# Patient Record
Sex: Male | Born: 1970 | Race: White | Hispanic: No | Smoking: Current every day smoker
Health system: Southern US, Community
[De-identification: ages and names within clinical notes are randomized; demographics above are authoritative.]

## PROBLEM LIST (undated history)

## (undated) DIAGNOSIS — I10 Essential (primary) hypertension: Secondary | ICD-10-CM

## (undated) DIAGNOSIS — I509 Heart failure, unspecified: Secondary | ICD-10-CM

## (undated) DIAGNOSIS — K047 Periapical abscess without sinus: Secondary | ICD-10-CM

## (undated) DIAGNOSIS — K4090 Unilateral inguinal hernia, without obstruction or gangrene, not specified as recurrent: Secondary | ICD-10-CM

## (undated) DIAGNOSIS — I517 Cardiomegaly: Secondary | ICD-10-CM

---

## 2011-12-31 ENCOUNTER — Emergency Department: Payer: Self-pay | Admitting: Emergency Medicine

## 2015-05-28 ENCOUNTER — Other Ambulatory Visit: Payer: Self-pay

## 2015-05-28 ENCOUNTER — Emergency Department
Admission: EM | Admit: 2015-05-28 | Discharge: 2015-05-28 | Disposition: A | Discharge status: Home / Self Care | Payer: Self-pay | Attending: Emergency Medicine | Admitting: Emergency Medicine

## 2015-05-28 ENCOUNTER — Encounter: Payer: Self-pay | Admitting: *Deleted

## 2015-05-28 DIAGNOSIS — A084 Viral intestinal infection, unspecified: Secondary | ICD-10-CM | POA: Insufficient documentation

## 2015-05-28 DIAGNOSIS — N289 Disorder of kidney and ureter, unspecified: Secondary | ICD-10-CM | POA: Insufficient documentation

## 2015-05-28 DIAGNOSIS — E860 Dehydration: Secondary | ICD-10-CM | POA: Insufficient documentation

## 2015-05-28 DIAGNOSIS — F17200 Nicotine dependence, unspecified, uncomplicated: Secondary | ICD-10-CM | POA: Insufficient documentation

## 2015-05-28 HISTORY — DX: Periapical abscess without sinus: K04.7

## 2015-05-28 HISTORY — DX: Cardiomegaly: I51.7

## 2015-05-28 HISTORY — DX: Unilateral inguinal hernia, without obstruction or gangrene, not specified as recurrent: K40.90

## 2015-05-28 LAB — CBC WITH DIFFERENTIAL/PLATELET
BASOS ABS: 0.1 10*3/uL (ref 0–0.1)
BASOS PCT: 0 %
EOS ABS: 0.1 10*3/uL (ref 0–0.7)
EOS PCT: 0 %
HCT: 55 % — ABNORMAL HIGH (ref 40.0–52.0)
Hemoglobin: 18.1 g/dL — ABNORMAL HIGH (ref 13.0–18.0)
Lymphocytes Relative: 4 %
Lymphs Abs: 1.1 10*3/uL (ref 1.0–3.6)
MCH: 29 pg (ref 26.0–34.0)
MCHC: 32.9 g/dL (ref 32.0–36.0)
MCV: 88.1 fL (ref 80.0–100.0)
MONO ABS: 1.5 10*3/uL — AB (ref 0.2–1.0)
MONOS PCT: 6 %
Neutro Abs: 22.8 10*3/uL — ABNORMAL HIGH (ref 1.4–6.5)
Neutrophils Relative %: 90 %
PLATELETS: 349 10*3/uL (ref 150–440)
RBC: 6.24 MIL/uL — ABNORMAL HIGH (ref 4.40–5.90)
RDW: 13.7 % (ref 11.5–14.5)
WBC: 25.6 10*3/uL — ABNORMAL HIGH (ref 3.8–10.6)

## 2015-05-28 LAB — COMPREHENSIVE METABOLIC PANEL
ALBUMIN: 4.2 g/dL (ref 3.5–5.0)
ALK PHOS: 102 U/L (ref 38–126)
ALT: 28 U/L (ref 17–63)
ANION GAP: 16 — AB (ref 5–15)
AST: 23 U/L (ref 15–41)
BILIRUBIN TOTAL: 1.5 mg/dL — AB (ref 0.3–1.2)
BUN: 22 mg/dL — AB (ref 6–20)
CALCIUM: 9.3 mg/dL (ref 8.9–10.3)
CO2: 23 mmol/L (ref 22–32)
CREATININE: 2.22 mg/dL — AB (ref 0.61–1.24)
Chloride: 96 mmol/L — ABNORMAL LOW (ref 101–111)
GFR calc Af Amer: 40 mL/min — ABNORMAL LOW (ref >60)
GFR calc non Af Amer: 34 mL/min — ABNORMAL LOW (ref >60)
GLUCOSE: 119 mg/dL — AB (ref 65–99)
Potassium: 3.4 mmol/L — ABNORMAL LOW (ref 3.5–5.1)
Sodium: 135 mmol/L (ref 135–145)
TOTAL PROTEIN: 6.9 g/dL (ref 6.5–8.1)

## 2015-05-28 LAB — CREATININE, SERUM
CREATININE: 1.85 mg/dL — AB (ref 0.61–1.24)
GFR calc non Af Amer: 43 mL/min — ABNORMAL LOW (ref >60)
GFR, EST AFRICAN AMERICAN: 49 mL/min — AB (ref >60)

## 2015-05-28 LAB — TROPONIN I

## 2015-05-28 LAB — MAGNESIUM: Magnesium: 1.5 mg/dL — ABNORMAL LOW (ref 1.7–2.4)

## 2015-05-28 MED ORDER — SODIUM CHLORIDE 0.9 % IV BOLUS (SEPSIS)
1000.0000 mL | Freq: Once | INTRAVENOUS | Status: AC
Start: 2015-05-28 — End: 2015-05-28
  Administered 2015-05-28: 1000 mL via INTRAVENOUS

## 2015-05-28 MED ORDER — ONDANSETRON HCL 4 MG/2ML IJ SOLN
4.0000 mg | Freq: Once | INTRAMUSCULAR | Status: AC
  Administered 2015-05-28: 4 mg via INTRAVENOUS
  Filled 2015-05-28: qty 2

## 2015-05-28 MED ORDER — SODIUM CHLORIDE 0.9 % IV BOLUS (SEPSIS)
1000.0000 mL | Freq: Once | INTRAVENOUS | Status: AC
  Administered 2015-05-28: 1000 mL via INTRAVENOUS

## 2015-05-28 MED ORDER — ONDANSETRON HCL 4 MG PO TABS: 4.0000 mg | ORAL_TABLET | Freq: Three times a day (TID) | ORAL | Status: DC | PRN

## 2015-05-28 NOTE — ED Notes (Addendum)
Per patient's report, he went to give plasma today and felt hot and weak and dizzy and had blurry vision. Patient was brought here by EMS from homeless [facility] after having vomiting and diarrhea since returning from giving plasma. Patient states he had chest pain last night at work. Patient also reports having infection in 3 teeth. Patient states he was discharged from [facility] last week for bipolar episode.

## 2015-05-28 NOTE — Discharge Instructions (Signed)
Dehydration, Adult °Dehydration is a condition in which you do not have enough fluid or water in your body. It happens when you take in less fluid than you lose. Vital organs such as the kidneys, brain, and heart cannot function without a proper amount of fluids. Any loss of fluids from the body can cause dehydration.  °Dehydration can range from mild to severe. This condition should be treated right away to help prevent it from becoming severe. °CAUSES  °This condition may be caused by: °· Vomiting. °· Diarrhea. °· Excessive sweating, such as when exercising in hot or humid weather. °· Not drinking enough fluid during strenuous exercise or during an illness. °· Excessive urine output. °· Fever. °· Certain medicines. °RISK FACTORS °This condition is more likely to develop in: °· People who are taking certain medicines that cause the body to lose excess fluid (diuretics).   °· People who have a chronic illness, such as diabetes, that may increase urination. °· Older adults.   °· People who live at high altitudes.   °· People who participate in endurance sports.   °SYMPTOMS  °Mild Dehydration °· Thirst. °· Dry lips. °· Slightly dry mouth. °· Dry, warm skin. °Moderate Dehydration °· Very dry mouth.   °· Muscle cramps.   °· Dark urine and decreased urine production.   °· Decreased tear production.   °· Headache.   °· Light-headedness, especially when you stand up from a sitting position.   °Severe Dehydration °· Changes in skin.   °¨ Cold and clammy skin.   °¨ Skin does not spring back quickly when lightly pinched and released.   °· Changes in body fluids.   °¨ Extreme thirst.   °¨ No tears.   °¨ Not able to sweat when body temperature is high, such as in hot weather.   °¨ Minimal urine production.   °· Changes in vital signs.   °¨ Rapid, weak pulse (more than 100 beats per minute when you are sitting still).   °¨ Rapid breathing.   °¨ Low blood pressure.   °· Other changes.   °¨ Sunken eyes.   °¨ Cold hands and feet.    °¨ Confusion. °¨ Lethargy and difficulty being awakened. °¨ Fainting (syncope).   °¨ Short-term weight loss.   °¨ Unconsciousness. °DIAGNOSIS  °This condition may be diagnosed based on your symptoms. You may also have tests to determine how severe your dehydration is. These tests may include:  °· Urine tests.   °· Blood tests.   °TREATMENT  °Treatment for this condition depends on the severity. Mild or moderate dehydration can often be treated at home. Treatment should be started right away. Do not wait until dehydration becomes severe. Severe dehydration needs to be treated at the hospital. °Treatment for Mild Dehydration °· Drinking plenty of water to replace the fluid you have lost.   °· Replacing minerals in your blood (electrolytes) that you may have lost.   °Treatment for Moderate Dehydration  °· Consuming oral rehydration solution (ORS). °Treatment for Severe Dehydration °· Receiving fluid through an IV tube.   °· Receiving electrolyte solution through a feeding tube that is passed through your nose and into your stomach (nasogastric tube or NG tube). °· Correcting any abnormalities in electrolytes. °HOME CARE INSTRUCTIONS  °· Drink enough fluid to keep your urine clear or pale yellow.   °· Drink water or fluid slowly by taking small sips. You can also try sucking on ice cubes.  °· Have food or beverages that contain electrolytes. Examples include bananas and sports drinks. °· Take over-the-counter and prescription medicines only as told by your health care provider.   °· Prepare ORS according to the manufacturer's instructions. Take sips   of ORS every 5 minutes until your urine returns to normal.  If you have vomiting or diarrhea, continue to try to drink water, ORS, or both.   If you have diarrhea, avoid:   Beverages that contain caffeine.   Fruit juice.   Milk.   Carbonated soft drinks.  Do not take salt tablets. This can lead to the condition of having too much sodium in your body  (hypernatremia).  SEEK MEDICAL CARE IF:  You cannot eat or drink without vomiting.  You have had moderate diarrhea during a period of more than 24 hours.  You have a fever. SEEK IMMEDIATE MEDICAL CARE IF:   You have extreme thirst.  You have severe diarrhea.  You have not urinated in 6-8 hours, or you have urinated only a small amount of very dark urine.  You have shriveled skin.  You are dizzy, confused, or both.   This information is not intended to replace advice given to you by your health care provider. Make sure you discuss any questions you have with your health care provider.   Document Released: 05/25/2005 Document Revised: 02/13/2015 Document Reviewed: 10/10/2014 Elsevier Interactive Patient Education [employer]  Please return immediately if condition worsens. Please contact her primary physician or the physician you were given for referral. If you have any specialist physicians involved in her treatment and plan please also contact them. Thank you for using [place] [facility]. Please drink plenty of fluids and advance her diet as tolerated. If he develops a fever, bloody diarrhea, or any other new concerns and all means return to emergency department for further evaluation

## 2015-05-28 NOTE — ED Provider Notes (Signed)
Time Seen: Approximately *1620 I have reviewed the triage notes  Chief Complaint: Emesis and Diarrhea   History of Present Illness: Paul Mccall is a 44 y.o. male *who presents with acute onset of nausea vomiting and diarrhea. Patient states the symptoms started approximately at noon. [facility]. He states earlier today that he was giving plasma and states that he had to stop because he was feeling hot, weak and dizzy. He denies any syncopal episode. He denies any chest pain at this time but states yesterday he had some chest discomfort while he was at work. It sounds as though it was very brief in nature without any radiation to the arm job back or flank area. He denies any shortness of breath. He states he was recently discharged from [facility] where he was under treatment for a bipolar episode. He states he was prescribed multiple medications such as Seroquel, etc. has not had any of his medication filled at this time. He denies any suicidal thoughts, homicidal thoughts, hallucinations. He's had recent dental extraction but states he was not placed on antibiotics and has had no recent travel Past Medical History  Diagnosis Date  . Enlarged heart   . Hernia, inguinal, right   . Dental infection     There are no active problems to display for this patient.   No past surgical history on file.  No past surgical history on file.  No current outpatient prescriptions on file.  Allergies:  Review of patient's allergies indicates no known allergies.  Family History: No family history on file.  Social History: Social History  Substance Use Topics  . Smoking status: Current Every Day Smoker  . Smokeless tobacco: Not on file  . Alcohol Use: Yes     Comment: occasionally   No illicit drug usage  Review of Systems:   10 point review of systems was performed and was otherwise negative:  Constitutional: No fever Eyes: No visual  disturbances ENT: No sore throat, ear pain Cardiac: No current chest pain Respiratory: No shortness of breath, wheezing, or stridor Abdomen: Mild diffuse abdominal cramping without melena or hematochezia. He denies any hematemesis or biliary emesis. He states mostly loose watery stool. Endocrine: No weight loss, No night sweats Extremities: No peripheral edema, cyanosis Skin: No rashes, easy bruising Neurologic: No focal weakness, trouble with speech or swollowing Urologic: No dysuria, Hematuria, or urinary frequency   Physical Exam:  ED Triage Vitals  Enc Vitals Group     BP 05/28/15 1549 71/47 mmHg     Pulse Rate 05/28/15 1549 113     Resp 05/28/15 1549 22     Temp 05/28/15 1549 97.5 F (36.4 C)     Temp Source 05/28/15 1549 Oral     SpO2 05/28/15 1549 96 %     Weight 05/28/15 1549 180 lb (81.647 kg)     Height 05/28/15 1549  (1.702 m)     Head Cir --      Peak Flow --      Pain Score 05/28/15 1550 8     Pain Loc --      Pain Edu? --      Excl. in GC? --     General: Awake , Alert , and Oriented times 3; GCS 15 Head: Normal cephalic , atraumatic Eyes: Pupils equal , round, reactive to light Nose/Throat: No nasal drainage, patent upper airway without erythema or exudate.  Neck: Supple, Full range  of motion, No anterior adenopathy or palpable thyroid masses Lungs: Clear to ascultation without wheezes , rhonchi, or rales Heart: Regular rate, regular rhythm without murmurs , gallops , or rubs Abdomen: Soft, non tender without rebound, guarding , or rigidity; bowel sounds positive and symmetric in all 4 quadrants. No organomegaly .        Extremities: 2 plus symmetric pulses. No edema, clubbing or cyanosis Neurologic: normal ambulation, Motor symmetric without deficits, sensory intact Skin: warm, dry, no rashes   Labs:   All laboratory work was reviewed including any pertinent negatives or positives listed below:  Labs Reviewed  CBC WITH DIFFERENTIAL/PLATELET -  Abnormal; Notable for the following:    WBC 25.6 (*)    RBC 6.24 (*)    Hemoglobin 18.1 (*)    HCT 55.0 (*)    Neutro Abs 22.8 (*)    Monocytes Absolute 1.5 (*)    All other components within normal limits  C DIFFICILE QUICK SCREEN W PCR REFLEX  GASTROINTESTINAL PANEL BY PCR, STOOL (REPLACES STOOL CULTURE)  COMPREHENSIVE METABOLIC PANEL  MAGNESIUM  TROPONIN I   The patient was unable to supply [place] with a stool sample after several hours of observation here in emergency department. His initial creatinine level was 2.22 and after a liter of fluid repeat was down to 1.85. EKG: ED ECG REPORT I, Jennye Moccasin, the attending physician, personally viewed and interpreted this ECG.  Date: 05/28/2015 EKG Time: 1555 Rate: 103 Rhythm: normal sinus rhythm QRS Axis: normal Intervals: normal ST/T Wave abnormalities: normal Conduction Disutrbances: none Narrative Interpretation: unremarkable Left ventricular hypertrophy    ED Course:  Patient was given a total of 2 L of fluid and had stabilization of his heart rate along with his blood pressure. Patient denies any symptoms of nausea at this time after a single dose of IV Zofran. He is able to tolerate oral fluids and with the decrease in his creatinine and I gave him another additional liter of fluid. I felt he would continue to improve on an outpatient basis. Along with the giving of blood. His white count is likely elevated due to hemoconcentration given the height hemoglobin and hematocrit at this time. He has no abdominal pain or findings of a surgical issue.    Assessment:  Viral gastroenteritis Dehydration Mild renal insufficiency    Plan: * Outpatient management Patient was advised to return immediately if condition worsens. Patient was advised to follow up with their primary care physician or other specialized physicians involved in their outpatient care             Jennye Moccasin, MD 05/28/15 2124

## 2017-02-23 ENCOUNTER — Emergency Department: Payer: Self-pay

## 2017-02-23 ENCOUNTER — Emergency Department
Admission: EM | Admit: 2017-02-23 | Discharge: 2017-02-23 | Disposition: A | Discharge status: Home / Self Care | Payer: Self-pay | Attending: Emergency Medicine | Admitting: Emergency Medicine

## 2017-02-23 ENCOUNTER — Encounter: Payer: Self-pay | Admitting: *Deleted

## 2017-02-23 DIAGNOSIS — F17200 Nicotine dependence, unspecified, uncomplicated: Secondary | ICD-10-CM | POA: Insufficient documentation

## 2017-02-23 DIAGNOSIS — R1032 Left lower quadrant pain: Secondary | ICD-10-CM | POA: Insufficient documentation

## 2017-02-23 DIAGNOSIS — R109 Unspecified abdominal pain: Secondary | ICD-10-CM

## 2017-02-23 DIAGNOSIS — I861 Scrotal varices: Secondary | ICD-10-CM | POA: Insufficient documentation

## 2017-02-23 HISTORY — DX: Heart failure, unspecified: I50.9

## 2017-02-23 HISTORY — DX: Essential (primary) hypertension: I10

## 2017-02-23 LAB — COMPREHENSIVE METABOLIC PANEL
ALBUMIN: 3.9 g/dL (ref 3.5–5.0)
ALK PHOS: 95 U/L (ref 38–126)
ALT: 17 U/L (ref 17–63)
AST: 17 U/L (ref 15–41)
Anion gap: 10 (ref 5–15)
BILIRUBIN TOTAL: 0.6 mg/dL (ref 0.3–1.2)
BUN: 10 mg/dL (ref 6–20)
CALCIUM: 9.1 mg/dL (ref 8.9–10.3)
CO2: 22 mmol/L (ref 22–32)
CREATININE: 0.91 mg/dL (ref 0.61–1.24)
Chloride: 107 mmol/L (ref 101–111)
GFR calc Af Amer: >60 mL/min (ref >60)
GFR calc non Af Amer: >60 mL/min (ref >60)
GLUCOSE: 86 mg/dL (ref 65–99)
Potassium: 3.7 mmol/L (ref 3.5–5.1)
Sodium: 139 mmol/L (ref 135–145)
TOTAL PROTEIN: 7.1 g/dL (ref 6.5–8.1)

## 2017-02-23 LAB — CBC
HEMATOCRIT: 45.4 % (ref 40.0–52.0)
HEMOGLOBIN: 15.7 g/dL (ref 13.0–18.0)
MCH: 29.6 pg (ref 26.0–34.0)
MCHC: 34.6 g/dL (ref 32.0–36.0)
MCV: 85.7 fL (ref 80.0–100.0)
Platelets: 276 10*3/uL (ref 150–440)
RBC: 5.3 MIL/uL (ref 4.40–5.90)
RDW: 13.6 % (ref 11.5–14.5)
WBC: 9.6 10*3/uL (ref 3.8–10.6)

## 2017-02-23 LAB — URINALYSIS, COMPLETE (UACMP) WITH MICROSCOPIC
BACTERIA UA: NONE SEEN
Bilirubin Urine: NEGATIVE
Glucose, UA: NEGATIVE mg/dL
KETONES UR: NEGATIVE mg/dL
Leukocytes, UA: NEGATIVE
NITRITE: NEGATIVE
PROTEIN: NEGATIVE mg/dL
Specific Gravity, Urine: 1.006 (ref 1.005–1.030)
Squamous Epithelial / LPF: NONE SEEN
pH: 6 (ref 5.0–8.0)

## 2017-02-23 LAB — LIPASE, BLOOD: Lipase: 19 U/L (ref 11–51)

## 2017-02-23 MED ORDER — IOPAMIDOL (ISOVUE-300) INJECTION 61%
30.0000 mL | Freq: Once | INTRAVENOUS | Status: AC
  Administered 2017-02-23: 30 mL via ORAL

## 2017-02-23 MED ORDER — DOXYCYCLINE HYCLATE 100 MG PO TABS: 100.0000 mg | ORAL_TABLET | Freq: Two times a day (BID) | ORAL | Status: DC

## 2017-02-23 MED ORDER — IOPAMIDOL (ISOVUE-300) INJECTION 61%
100.0000 mL | Freq: Once | INTRAVENOUS | Status: AC | PRN
  Administered 2017-02-23: 100 mL via INTRAVENOUS

## 2017-02-23 MED ORDER — HYDROMORPHONE HCL 1 MG/ML IJ SOLN
1.0000 mg | Freq: Once | INTRAMUSCULAR | Status: AC
Start: 2017-02-23 — End: 2017-02-23
  Administered 2017-02-23: 1 mg via INTRAVENOUS
  Filled 2017-02-23: qty 1

## 2017-02-23 MED ORDER — OXYCODONE-ACETAMINOPHEN 5-325 MG PO TABS: 1.0000 | ORAL_TABLET | Freq: Four times a day (QID) | ORAL | 0 refills | Status: AC | PRN

## 2017-02-23 MED ORDER — SODIUM CHLORIDE 0.9 % IV BOLUS (SEPSIS)
500.0000 mL | Freq: Once | INTRAVENOUS | Status: AC
  Administered 2017-02-23: 500 mL via INTRAVENOUS

## 2017-02-23 MED ORDER — ONDANSETRON HCL 4 MG PO TABS: 4.0000 mg | ORAL_TABLET | Freq: Three times a day (TID) | ORAL | 0 refills | Status: DC | PRN

## 2017-02-23 MED ORDER — ONDANSETRON HCL 4 MG/2ML IJ SOLN
4.0000 mg | Freq: Once | INTRAMUSCULAR | Status: AC
  Administered 2017-02-23: 4 mg via INTRAVENOUS
  Filled 2017-02-23: qty 2

## 2017-02-23 NOTE — ED Provider Notes (Addendum)
[facility]  ____________________________________________   I have reviewed the triage vital signs and the nursing notes.   HISTORY  Chief Complaint Abdominal Pain    HPI Johnmichael Helman is a 46 y.o. male who states she had a hernia repair little over a month ago at [facility] and at that time he was told he had diverticulosis. He states he's been doing well since his surgery and then this morning at 4:00 he woke up with left lower quadrant abdominal pain. No vomiting no fever positive bowel movement last bowel movement this morning it was diarrhea nonradiating, sharp, nothing makes it better and nothing makes it worse. Feels like "a stitch in side".     Past Medical History:  Diagnosis Date  . Dental infection   . Enlarged heart   . Hernia, inguinal, right     There are no active problems to display for this patient.   History reviewed. No pertinent surgical history.  Prior to Admission medications   Medication Sig Start Date End Date Taking? Authorizing Provider  ondansetron (ZOFRAN) 4 MG tablet Take 1 tablet (4 mg total) by mouth every 8 (eight) hours as needed for nausea or vomiting. 05/28/15   Jennye Moccasin, MD    Allergies Patient has no known allergies.  History reviewed. No pertinent family history.  Social History Social History  Substance Use Topics  . Smoking status: Current Every Day Smoker  . Smokeless tobacco: Not on file  . Alcohol use Yes     Comment: occasionally    Review of Systems Constitutional: No fever/chills Eyes: No visual changes. ENT: No sore throat. No stiff neck no neck pain Cardiovascular: Denies chest pain. Respiratory: Denies shortness of breath. Gastrointestinal:   See history of present illness Genitourinary: Negative for dysuria. Musculoskeletal: Negative lower extremity swelling Skin: Negative for rash. Neurological: Negative for severe headaches, focal  weakness or numbness.   ____________________________________________   PHYSICAL EXAM:  VITAL SIGNS: ED Triage Vitals  Enc Vitals Group     BP 02/23/17 0948 (!) 151/112     Pulse Rate 02/23/17 0948 91     Resp 02/23/17 0948 16     Temp 02/23/17 0948 97.6 F (36.4 C)     Temp Source 02/23/17 0948 Oral     SpO2 02/23/17 0948 98 %     Weight 02/23/17 0948 175 lb (79.4 kg)     Height 02/23/17 0948 5\' 8"  (1.727 m)     Head Circumference --      Peak Flow --      Pain Score 02/23/17 0947 10     Pain Loc --      Pain Edu? --      Excl. in GC? --     Constitutional: Alert and oriented. Well appearing Ears uncomfortable but nontoxic Eyes: Conjunctivae are normal Head: Atraumatic HEENT: No congestion/rhinnorhea. Mucous membranes are moist.  Oropharynx non-erythematous Neck:   Nontender with no meningismus, no masses, no stridor Cardiovascular: Normal rate, regular rhythm. Grossly normal heart sounds.  Good peripheral circulation. Respiratory: Normal respiratory effort.  No retractions. Lungs CTAB. Abdominal: Soft and Tender to palpation in the left lower quadrant, voluntary guarding, no rebound. No distention. Back:  There is no focal tenderness or step off.  there is no midline tenderness there are no lesions noted. there is no CVA tenderness GU: There is a fullness noted to the left testicle region but no pain or erythema Musculoskeletal: No lower extremity  tenderness, no upper extremity tenderness. No joint effusions, no DVT signs strong distal pulses no edema Neurologic:  Normal speech and language. No gross focal neurologic deficits are appreciated.  Skin:  Skin is warm, dry and intact. No rash noted. Psychiatric: Mood and affect are normal. Speech and behavior are normal.  ____________________________________________   LABS (all labs ordered are listed, but only abnormal results are displayed)  Labs Reviewed  LIPASE, BLOOD  COMPREHENSIVE METABOLIC PANEL  CBC   URINALYSIS, COMPLETE (UACMP) WITH MICROSCOPIC   ____________________________________________  EKG  I personally interpreted any EKGs ordered by me or triage Size rhythm rate 92 bpm normal axis no acute ST elevation or depression, nonspecific ST changes, PVC noted ____________________________________________  RADIOLOGY  I reviewed any imaging ordered by me or triage that were performed during my shift and, if possible, patient and/or family made aware of any abnormal findings. ____________________________________________   PROCEDURES  Procedure(s) performed: None  Procedures  Critical Care performed: None  ____________________________________________   INITIAL IMPRESSION / ASSESSMENT AND PLAN / ED COURSE  Pertinent labs & imaging results that were available during my care of the patient were reviewed by me and considered in my medical decision making (see chart for details).  pt with abdominal pain, one month or so status post hernia surgery. We will obtain CT scan, he also has a history of diverticulosis, differential would include abscess from surgery which is somewhat late, internal obstruction although low suspicion of this, as well as diverticular pathology etc.  ----------------------------------------- 1:10 PM on 02/23/2017 -----------------------------------------  Patient pain is better controlled. This time, CT scan is quite reassuring. 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 Blood work is reassuring CT scan is reassuring vital signs are reassuring. Patient is doing better we'll try by mouth challenge. Given diarrhea which she initially did not report and inflammatory changes consistent with  infectious pathology in his abdomen, this is most likely a viral pathology no evidence of acute surgical pathology noted today. 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 Patient does have some discomfort, may require short course of pain medication at home. Return precautions and follow-up given and understood.  ----------------------------------------- 2:39 PM on 02/23/2017 -----------------------------------------  She is feeling much better abdomen remains benign, he is sleeping in the room, we will discharge him with close outpatient follow-up with urology. Incidentally noted variceal and with his home surgeon. In addition, return precautions here been given and understood She declines ultrasound or further imaging of his scrotum and I don't think is unreasonable as it is nontender and according to him chronic. Certainly to [place] from the obvious clear source of his pain which is his left side of his abdomen where his tenderness is and where also he has CT findings of inflammation without evidence of perforation or diverticular disease      ____________________________________________   FINAL CLINICAL IMPRESSION(S) / ED DIAGNOSES  Final diagnoses:  None      This chart was dictated using voice recognition software.  Despite best efforts to proofread,  errors can occur  which can change meaning.      Jeanmarie Plant, MD 02/23/17 1012    Jeanmarie Plant, MD 02/23/17 1312    Jeanmarie Plant, MD 02/23/17 1440    Jeanmarie Plant, MD 02/23/17 1447

## 2017-02-23 NOTE — ED Notes (Signed)
Patient transported to CT 

## 2017-02-23 NOTE — ED Notes (Signed)
Pt given meal tray per edp

## 2017-02-23 NOTE — ED Triage Notes (Signed)
Pt arrives with complaints of left sided abd pain that began this AM at 0400, states nausea, last BM this AM, states hernia surgery last month, states pain comes and goes like spasms

## 2017-02-23 NOTE — ED Notes (Signed)
Pt to eat prior to discharge

## 2017-02-23 NOTE — ED Notes (Signed)
Patient to Room 5.  [facility] RN aware of placement in room.

## 2018-07-11 ENCOUNTER — Inpatient Hospital Stay (HOSPITAL_COMMUNITY)
Admission: EM | Admit: 2018-07-11 | Discharge: 2018-07-19 | DRG: 291 | Disposition: A | Discharge status: Home / Self Care | Payer: Medicaid Other | Attending: Internal Medicine | Admitting: Internal Medicine

## 2018-07-11 ENCOUNTER — Other Ambulatory Visit: Payer: Self-pay

## 2018-07-11 ENCOUNTER — Encounter (HOSPITAL_COMMUNITY): Payer: Self-pay | Admitting: *Deleted

## 2018-07-11 ENCOUNTER — Emergency Department (HOSPITAL_COMMUNITY): Payer: Medicaid Other

## 2018-07-11 ENCOUNTER — Inpatient Hospital Stay (HOSPITAL_COMMUNITY): Payer: Medicaid Other

## 2018-07-11 DIAGNOSIS — I5082 Biventricular heart failure: Secondary | ICD-10-CM | POA: Diagnosis present

## 2018-07-11 DIAGNOSIS — F419 Anxiety disorder, unspecified: Secondary | ICD-10-CM | POA: Diagnosis present

## 2018-07-11 DIAGNOSIS — I130 Hypertensive heart and chronic kidney disease with heart failure and stage 1 through stage 4 chronic kidney disease, or unspecified chronic kidney disease: Secondary | ICD-10-CM | POA: Diagnosis present

## 2018-07-11 DIAGNOSIS — I240 Acute coronary thrombosis not resulting in myocardial infarction: Secondary | ICD-10-CM | POA: Diagnosis present

## 2018-07-11 DIAGNOSIS — I509 Heart failure, unspecified: Secondary | ICD-10-CM

## 2018-07-11 DIAGNOSIS — F17210 Nicotine dependence, cigarettes, uncomplicated: Secondary | ICD-10-CM | POA: Diagnosis present

## 2018-07-11 DIAGNOSIS — Z79899 Other long term (current) drug therapy: Secondary | ICD-10-CM | POA: Diagnosis not present

## 2018-07-11 DIAGNOSIS — F319 Bipolar disorder, unspecified: Secondary | ICD-10-CM | POA: Diagnosis present

## 2018-07-11 DIAGNOSIS — Z9114 Patient's other noncompliance with medication regimen: Secondary | ICD-10-CM | POA: Diagnosis not present

## 2018-07-11 DIAGNOSIS — E876 Hypokalemia: Secondary | ICD-10-CM | POA: Diagnosis present

## 2018-07-11 DIAGNOSIS — I5023 Acute on chronic systolic (congestive) heart failure: Secondary | ICD-10-CM | POA: Diagnosis present

## 2018-07-11 DIAGNOSIS — R079 Chest pain, unspecified: Secondary | ICD-10-CM | POA: Diagnosis present

## 2018-07-11 DIAGNOSIS — E871 Hypo-osmolality and hyponatremia: Secondary | ICD-10-CM | POA: Diagnosis present

## 2018-07-11 DIAGNOSIS — I472 Ventricular tachycardia: Secondary | ICD-10-CM | POA: Diagnosis present

## 2018-07-11 DIAGNOSIS — Z9119 Patient's noncompliance with other medical treatment and regimen: Secondary | ICD-10-CM | POA: Diagnosis not present

## 2018-07-11 DIAGNOSIS — I447 Left bundle-branch block, unspecified: Secondary | ICD-10-CM | POA: Diagnosis present

## 2018-07-11 DIAGNOSIS — I080 Rheumatic disorders of both mitral and aortic valves: Secondary | ICD-10-CM | POA: Diagnosis present

## 2018-07-11 DIAGNOSIS — F1410 Cocaine abuse, uncomplicated: Secondary | ICD-10-CM | POA: Diagnosis present

## 2018-07-11 DIAGNOSIS — N182 Chronic kidney disease, stage 2 (mild): Secondary | ICD-10-CM | POA: Diagnosis present

## 2018-07-11 DIAGNOSIS — K761 Chronic passive congestion of liver: Secondary | ICD-10-CM | POA: Diagnosis present

## 2018-07-11 DIAGNOSIS — R1010 Upper abdominal pain, unspecified: Secondary | ICD-10-CM

## 2018-07-11 DIAGNOSIS — I420 Dilated cardiomyopathy: Secondary | ICD-10-CM | POA: Diagnosis present

## 2018-07-11 DIAGNOSIS — N179 Acute kidney failure, unspecified: Secondary | ICD-10-CM | POA: Diagnosis present

## 2018-07-11 LAB — I-STAT TROPONIN, ED: Troponin i, poc: 0.03 ng/mL (ref 0.00–0.08)

## 2018-07-11 LAB — CBC
HCT: 44.3 % (ref 39.0–52.0)
Hemoglobin: 13.8 g/dL (ref 13.0–17.0)
MCH: 27.7 pg (ref 26.0–34.0)
MCHC: 31.2 g/dL (ref 30.0–36.0)
MCV: 89 fL (ref 80.0–100.0)
Platelets: 327 10*3/uL (ref 150–400)
RBC: 4.98 MIL/uL (ref 4.22–5.81)
RDW: 14 % (ref 11.5–15.5)
WBC: 12.9 10*3/uL — ABNORMAL HIGH (ref 4.0–10.5)
nRBC: 0 % (ref 0.0–0.2)

## 2018-07-11 LAB — HEPATIC FUNCTION PANEL
ALT: 63 U/L — ABNORMAL HIGH (ref 0–44)
AST: 52 U/L — ABNORMAL HIGH (ref 15–41)
Albumin: 3.2 g/dL — ABNORMAL LOW (ref 3.5–5.0)
Alkaline Phosphatase: 107 U/L (ref 38–126)
Bilirubin, Direct: 0.2 mg/dL (ref 0.0–0.2)
Indirect Bilirubin: 0.4 mg/dL (ref 0.3–0.9)
TOTAL PROTEIN: 6.8 g/dL (ref 6.5–8.1)
Total Bilirubin: 0.6 mg/dL (ref 0.3–1.2)

## 2018-07-11 LAB — BASIC METABOLIC PANEL
Anion gap: 14 (ref 5–15)
BUN: 15 mg/dL (ref 6–20)
CHLORIDE: 103 mmol/L (ref 98–111)
CO2: 23 mmol/L (ref 22–32)
Calcium: 9 mg/dL (ref 8.9–10.3)
Creatinine, Ser: 1.28 mg/dL — ABNORMAL HIGH (ref 0.61–1.24)
GFR calc Af Amer: >60 mL/min (ref >60)
GFR calc non Af Amer: >60 mL/min (ref >60)
Glucose, Bld: 147 mg/dL — ABNORMAL HIGH (ref 70–99)
Potassium: 3.1 mmol/L — ABNORMAL LOW (ref 3.5–5.1)
Sodium: 140 mmol/L (ref 135–145)

## 2018-07-11 LAB — BRAIN NATRIURETIC PEPTIDE: B NATRIURETIC PEPTIDE 5: 1099.3 pg/mL — AB (ref 0.0–100.0)

## 2018-07-11 LAB — LIPASE, BLOOD: Lipase: 32 U/L (ref 11–51)

## 2018-07-11 MED ORDER — POTASSIUM CHLORIDE CRYS ER 20 MEQ PO TBCR
20.0000 meq | EXTENDED_RELEASE_TABLET | Freq: Two times a day (BID) | ORAL | Status: DC
  Administered 2018-07-11: 20 meq via ORAL
  Filled 2018-07-11: qty 1

## 2018-07-11 MED ORDER — ONDANSETRON HCL 4 MG PO TABS: 4.0000 mg | ORAL_TABLET | Freq: Four times a day (QID) | ORAL | Status: DC | PRN

## 2018-07-11 MED ORDER — ENOXAPARIN SODIUM 40 MG/0.4ML ~~LOC~~ SOLN
40.0000 mg | SUBCUTANEOUS | Status: DC
  Administered 2018-07-11 – 2018-07-12 (×2): 40 mg via SUBCUTANEOUS
  Filled 2018-07-11 (×2): qty 0.4

## 2018-07-11 MED ORDER — SODIUM CHLORIDE 0.9% FLUSH: 3.0000 mL | Freq: Once | INTRAVENOUS | Status: DC

## 2018-07-11 MED ORDER — FUROSEMIDE 10 MG/ML IJ SOLN
80.0000 mg | Freq: Once | INTRAMUSCULAR | Status: AC
  Administered 2018-07-11: 80 mg via INTRAVENOUS
  Filled 2018-07-11: qty 8

## 2018-07-11 MED ORDER — POTASSIUM CHLORIDE 10 MEQ/100ML IV SOLN
10.0000 meq | INTRAVENOUS | Status: AC
  Administered 2018-07-11 – 2018-07-12 (×2): 10 meq via INTRAVENOUS
  Filled 2018-07-11 (×2): qty 100

## 2018-07-11 MED ORDER — ALBUTEROL SULFATE (2.5 MG/3ML) 0.083% IN NEBU: 2.5000 mg | INHALATION_SOLUTION | RESPIRATORY_TRACT | Status: DC | PRN

## 2018-07-11 MED ORDER — ONDANSETRON HCL 4 MG/2ML IJ SOLN: 4.0000 mg | Freq: Four times a day (QID) | INTRAMUSCULAR | Status: DC | PRN

## 2018-07-11 MED ORDER — GUAIFENESIN-DM 100-10 MG/5ML PO SYRP
5.0000 mL | ORAL_SOLUTION | ORAL | Status: DC | PRN
  Administered 2018-07-11 – 2018-07-13 (×3): 5 mL via ORAL
  Filled 2018-07-11 (×4): qty 5

## 2018-07-11 MED ORDER — FUROSEMIDE 10 MG/ML IJ SOLN
40.0000 mg | Freq: Two times a day (BID) | INTRAMUSCULAR | Status: DC
  Administered 2018-07-11 – 2018-07-12 (×2): 40 mg via INTRAVENOUS
  Filled 2018-07-11 (×2): qty 4

## 2018-07-11 MED ORDER — SODIUM CHLORIDE 0.9 % IV SOLN: 250.0000 mL | INTRAVENOUS | Status: DC | PRN

## 2018-07-11 MED ORDER — ONDANSETRON HCL 4 MG PO TABS: 4.0000 mg | ORAL_TABLET | Freq: Three times a day (TID) | ORAL | Status: DC | PRN

## 2018-07-11 MED ORDER — SODIUM CHLORIDE 0.9% FLUSH
3.0000 mL | Freq: Two times a day (BID) | INTRAVENOUS | Status: DC
  Administered 2018-07-11 – 2018-07-19 (×13): 3 mL via INTRAVENOUS

## 2018-07-11 MED ORDER — POTASSIUM CHLORIDE 10 MEQ/50ML IV SOLN
10.0000 meq | INTRAVENOUS | Status: DC
  Filled 2018-07-11 (×2): qty 50

## 2018-07-11 MED ORDER — SODIUM CHLORIDE 0.9% FLUSH
3.0000 mL | INTRAVENOUS | Status: DC | PRN
  Administered 2018-07-18: 3 mL via INTRAVENOUS
  Filled 2018-07-11: qty 3

## 2018-07-11 NOTE — ED Notes (Signed)
Pt. returned from XR. 

## 2018-07-11 NOTE — ED Notes (Signed)
Patient transported to X-ray 

## 2018-07-11 NOTE — H&P (Addendum)
[facility]                                                                                    Patient Demographics  Paul Mccall, is a 48 y.o. male  CSN: [id number]  MRN: [id number]  DOB - September 14, 1970  Admit Date - 07/11/2018  Outpatient Primary MD for the patient is Patient, No Pcp Per   With History of -  Past Medical History:  Diagnosis Date  . CHF (congestive heart failure) (HCC)   . Dental infection   . Enlarged heart   . Hernia, inguinal, right   . Hypertension       History reviewed. No pertinent surgical history.  in for   Chief Complaint  Patient presents with  . Shortness of Breath  . Cough     HPI  Paul Mccall  is a 48 y.o. male, with past medical history significant for nonischemic cardiomyopathy usually follows up at [facility] presenting with 1 day history of chest pain and shortness of breath.  The chest pain is pleuritic mainly.  Patient denies any nausea or cold sweats.  Patient had a cardiac catheterization on 1/ 07/2018 at [facility] which showed an ejection fraction of less than 20%.  Patient has been complaining of right upper quadrant pain for the last 2 weeks .  No history of leg swelling. In the ER patient was noted to have a BNP of 1000, chest x-ray showed cardiomegaly and EKG showing left bundle branch block.    Review of Systems    In addition to the HPI above No Fever-chills, No Headache, No changes with Vision or hearing, No problems swallowing food or Liquids, No Blood in stool or Urine, No dysuria, No new skin rashes or bruises, No new joints pains-aches,  No new weakness, tingling, numbness in any extremity, No recent weight gain or loss, No polyuria, polydypsia or polyphagia, No significant Mental Stressors.  A full 10 point Review of Systems was done, except as stated above, all other Review of Systems were negative.   Social History Social History   Tobacco Use  . Smoking status: Current Every Day Smoker  .  Smokeless tobacco: Never Used  Substance Use Topics  . Alcohol use: Yes    Comment: occasionally     Family History No family history on file.   Prior to Admission medications   Medication Sig Start Date End Date Taking? Authorizing Provider  ondansetron (ZOFRAN) 4 MG tablet Take 1 tablet (4 mg total) by mouth every 8 (eight) hours as needed for nausea or vomiting. 02/23/17   Jeanmarie Plant, MD    No Known Allergies  Physical Exam  Vitals  Blood pressure (!) 123/91, pulse (!) 109, temperature 98.4 F (36.9 C), temperature source Oral, resp. rate 19, SpO2 97 %.   1. General Young male, very pleasant looks acutely ill  2. Normal affect and insight, Not Suicidal or Homicidal, Awake Alert, Oriented X 3.  3. No F.N deficits, grossly, patient moving all extremities.  4. Ears and Eyes appear Normal, Conjunctivae clear, PERRLA. Moist Oral Mucosa.  5. Supple Neck, No JVD, No cervical lymphadenopathy appriciated, No Carotid  Bruits.  6. Symmetrical Chest wall movement, bilateral basilar crackles.  7. RRR, No Gallops, Rubs or Murmurs, No Parasternal Heave.  8. Positive Bowel Sounds, right upper quadrant tenderness noted.  9.  No Cyanosis, Normal Skin Turgor, No Skin Rash or Bruise.  10. Good muscle tone,  joints appear normal , no effusions, Normal ROM.    Data Review  CBC Recent Labs  Lab 07/11/18 1411  WBC 12.9*  HGB 13.8  HCT 44.3  PLT 327  MCV 89.0  MCH 27.7  MCHC 31.2  RDW 14.0   ------------------------------------------------------------------------------------------------------------------  Chemistries  Recent Labs  Lab 07/11/18 1411  NA 140  K 3.1*  CL 103  CO2 23  GLUCOSE 147*  BUN 15  CREATININE 1.28*  CALCIUM 9.0   ------------------------------------------------------------------------------------------------------------------ CrCl cannot be calculated (Unknown ideal  weight.). ------------------------------------------------------------------------------------------------------------------ No results for input(s): TSH, T4TOTAL, T3FREE, THYROIDAB in the last 72 hours.  Invalid input(s): FREET3   Coagulation profile No results for input(s): INR, PROTIME in the last 168 hours. ------------------------------------------------------------------------------------------------------------------- No results for input(s): DDIMER in the last 72 hours. -------------------------------------------------------------------------------------------------------------------  Cardiac Enzymes No results for input(s): CKMB, TROPONINI, MYOGLOBIN in the last 168 hours.  Invalid input(s): CK ------------------------------------------------------------------------------------------------------------------ Invalid input(s): POCBNP   ---------------------------------------------------------------------------------------------------------------  Urinalysis    Component Value Date/Time   COLORURINE YELLOW (A) 02/23/2017 1151   APPEARANCEUR CLEAR (A) 02/23/2017 1151   LABSPEC 1.006 02/23/2017 1151   PHURINE 6.0 02/23/2017 1151   GLUCOSEU NEGATIVE 02/23/2017 1151   HGBUR SMALL (A) 02/23/2017 1151   BILIRUBINUR NEGATIVE 02/23/2017 1151   KETONESUR NEGATIVE 02/23/2017 1151   PROTEINUR NEGATIVE 02/23/2017 1151   NITRITE NEGATIVE 02/23/2017 1151   LEUKOCYTESUR NEGATIVE 02/23/2017 1151    ----------------------------------------------------------------------------------------------------------------   Imaging results:   Dg Chest 2 View  Result Date: 07/11/2018 CLINICAL DATA:  Chest pain and shortness of breath today. EXAM: CHEST - 2 VIEW COMPARISON:  None. FINDINGS: Enlarged cardiac silhouette. Clear lungs with normal vasculature. Mild diffuse prominence of the interstitial markings. No pleural fluid. The bones appear osteopenic. IMPRESSION: Cardiomegaly and mild chronic  interstitial lung disease. Electronically Signed   By: Beckie Salts M.D.   On: 07/11/2018 14:55    My personal review of EKG: LBBB with sinus tach at 103 bpm  Assessment & Plan  Congestive heart failure, chronic, systolic with exacerbation Status post cardiac catheterization on one 07/2018 at [facility] with ejection fraction of less than 20% Lasix IV for diuresis Fluid restriction Cardiology consulted  Hypertension Patient was on Zestril which was DC'd because of chronic kidney disease Stable at this time Might benefit from beta-blocker but unknown time of cocaine ingestion.  We will leave it up to cardiology  Abdominal pain with right Check abdominal ultrasound  Substance abuse/cocaine.  Last time 1-1/2 days ago  Hyponatremia, resolved  Chronic kidney disease stage II Monitor with diuresis  Hypokalemia Repleted/check potassium level in a.m.  DVT Prophylaxis Lovenox  AM Labs Ordered, also please review Full Orders  Family Communication: Admission, patients condition and plan of care including tests being ordered have been discussed with the patient and girlfriend who indicate understanding and agree with the plan and Code Status.  Code Status full  Disposition Plan: Home  Time spent in minutes : 41 minutes  Condition GUARDED   @SIGNATURE @

## 2018-07-11 NOTE — ED Provider Notes (Signed)
[facility] EMERGENCY DEPARTMENT Provider Note   CSN: [id number] Arrival date & time: 07/11/18  1341     History   Chief Complaint Chief Complaint  Patient presents with  . Shortness of Breath  . Cough    HPI Paul Mccall is a 48 y.o. male.  Patient is a 48 year old male who has history of nonischemic cardiomyopathy, medication noncompliance, hypertension and cocaine abuse who presents with shortness of breath and chest pain.  He reports over the last 2 3 days he has had worsening shortness of breath.  He is also had some increased pressure across his upper abdomen which he typically gets when he is fluid overloaded.  He feels some intermittent tightness to the center and left side of his chest.  He also has had some mild URI symptoms with runny nose coughing and chest congestion.  He says he is coughing up some brownish sputum.  He denies any leg swelling but he says he never has any leg swelling with his CHF exacerbations.  He does not check his weight at home.  He is continuing to use cocaine with the last use about a day and a half ago.  He denies any other drug use.  He recently moved to [facility] from [place] and has been staying in a motel here for the last 2 weeks.  He reports he is taking his medications.     Past Medical History:  Diagnosis Date  . CHF (congestive heart failure) (HCC)   . Dental infection   . Enlarged heart   . Hernia, inguinal, right   . Hypertension     There are no active problems to display for this patient.   History reviewed. No pertinent surgical history.      Home Medications    Prior to Admission medications   Medication Sig Start Date End Date Taking? Authorizing Provider  ondansetron (ZOFRAN) 4 MG tablet Take 1 tablet (4 mg total) by mouth every 8 (eight) hours as needed for nausea or vomiting. 02/23/17   Jeanmarie Plant, MD    Family History No family history on file.  Social History Social History    Tobacco Use  . Smoking status: Current Every Day Smoker  . Smokeless tobacco: Never Used  Substance Use Topics  . Alcohol use: Yes    Comment: occasionally  . Drug use: Not on file     Allergies   Patient has no known allergies.   Review of Systems Review of Systems  Constitutional: Positive for fatigue. Negative for chills, diaphoresis and fever.  HENT: Positive for congestion and rhinorrhea. Negative for sneezing.   Eyes: Negative.   Respiratory: Positive for cough, chest tightness and shortness of breath.   Cardiovascular: Negative for chest pain and leg swelling.  Gastrointestinal: Positive for abdominal distention. Negative for abdominal pain, blood in stool, diarrhea, nausea and vomiting.  Genitourinary: Negative for difficulty urinating, flank pain, frequency and hematuria.  Musculoskeletal: Negative for arthralgias and back pain.  Skin: Negative for rash.  Neurological: Negative for dizziness, speech difficulty, weakness, numbness and headaches.     Physical Exam Updated Vital Signs BP (!) 123/91   Pulse (!) 109   Temp 98.4 F (36.9 C) (Oral)   Resp 19   SpO2 97%   Physical Exam Constitutional:      Appearance: He is well-developed.  HENT:     Head: Normocephalic and atraumatic.  Eyes:     Pupils: Pupils are equal, round, and reactive to  light.  Neck:     Musculoskeletal: Normal range of motion and neck supple.  Cardiovascular:     Rate and Rhythm: Regular rhythm. Tachycardia present.     Heart sounds: Murmur present.  Pulmonary:     Effort: Pulmonary effort is normal. Tachypnea present. No respiratory distress.     Breath sounds: Rales present. No wheezing.  Chest:     Chest wall: No tenderness.  Abdominal:     General: Bowel sounds are normal.     Palpations: Abdomen is soft.     Tenderness: There is no abdominal tenderness. There is no guarding or rebound.  Musculoskeletal: Normal range of motion.     Right lower leg: No edema.     Left  lower leg: No edema.  Lymphadenopathy:     Cervical: No cervical adenopathy.  Skin:    General: Skin is warm and dry.     Findings: No rash.  Neurological:     Mental Status: He is alert and oriented to person, place, and time.      ED Treatments / Results  Labs (all labs ordered are listed, but only abnormal results are displayed) Labs Reviewed  BASIC METABOLIC PANEL - Abnormal; Notable for the following components:      Result Value   Potassium 3.1 (*)    Glucose, Bld 147 (*)    Creatinine, Ser 1.28 (*)    All other components within normal limits  CBC - Abnormal; Notable for the following components:   WBC 12.9 (*)    All other components within normal limits  BRAIN NATRIURETIC PEPTIDE - Abnormal; Notable for the following components:   B Natriuretic Peptide 1,099.3 (*)    All other components within normal limits  HEPATIC FUNCTION PANEL  LIPASE, BLOOD  I-STAT TROPONIN, ED    EKG EKG Interpretation  Date/Time:  Monday July 11 2018 13:46:45 EST Ventricular Rate:  103 PR Interval:  158 QRS Duration: 156 QT Interval:  416 QTC Calculation: 544 R Axis:   130 Text Interpretation:  Sinus tachycardia Biatrial enlargement Non-specific intra-ventricular conduction block Abnormal ECG st elevation noted and reviewed with cardiology-Dr. Eldridge DaceVaranasi Confirmed by Margarita Grizzleay, Danielle [phone]) on 07/11/2018 2:13:27 PM   Radiology Dg Chest 2 View  Result Date: 07/11/2018 CLINICAL DATA:  Chest pain and shortness of breath today. EXAM: CHEST - 2 VIEW COMPARISON:  None. FINDINGS: Enlarged cardiac silhouette. Clear lungs with normal vasculature. Mild diffuse prominence of the interstitial markings. No pleural fluid. The bones appear osteopenic. IMPRESSION: Cardiomegaly and mild chronic interstitial lung disease. Electronically Signed   By: Beckie SaltsSteven  Reid M.D.   On: 07/11/2018 14:55    Procedures Procedures (including critical care time)  Medications Ordered in ED Medications  sodium  chloride flush (NS) 0.9 % injection 3 mL (3 mLs Intravenous Not Given 07/11/18 1442)  furosemide (LASIX) injection 80 mg (80 mg Intravenous Given 07/11/18 1544)     Initial Impression / Assessment and Plan / ED Course  I have reviewed the triage vital signs and the nursing notes.  Pertinent labs & imaging results that were available during my care of the patient were reviewed by me and considered in my medical decision making (see chart for details).     Patient is a 48 year old male with nonischemic cardiomyopathy.  His last echo showed an EF of less than 20%.  He presents with tachypnea, shortness of breath and tachycardia.  His EKG shows a left bundle branch block which is new compared to our prior  EKG from 2018.  This was reviewed by Dr. Eldridge Dace.  His chest x-ray shows some mild interstitial markings.  His BNP is markedly elevated.  His troponin is negative.  He is chest pain-free currently.  He was given IV Lasix.  He has diuresed but still feels short of breath.  He still tachypneic but is maintaining normal oxygen saturations.  I consulted Dr. Jacques Navy with cardiology who recommends a medicine admission.  She advises that if medicine wishes to consult cardiology they can do so.  Patient does present with some mild discomfort to his upper abdomen.  He says that he feels that fullness when he is fluid overloaded.  He does have on reexam some tenderness to his right upper quadrant.  He states that when he was in the ED the last time they were going to do an ultrasound but he left prior to this happening.  Given this, I did order a gallbladder ultrasound and LFTs which are still pending.  I spoke with Dr. Sharyon Medicus who will admit the pt.  Final Clinical Impressions(s) / ED Diagnoses   Final diagnoses:  Acute on chronic congestive heart failure, unspecified heart failure type (HCC)  Cocaine abuse (HCC)  Upper abdominal pain    ED Discharge Orders    None       Rolan Bucco, MD 07/11/18  1731

## 2018-07-11 NOTE — Plan of Care (Signed)
  Problem: Education: Goal: Knowledge of General Education information will improve Description Including pain rating scale, medication(s)/side effects and non-pharmacologic comfort measures Outcome: Progressing   Problem: Clinical Measurements: Goal: Respiratory complications will improve Outcome: Progressing Pt. Short of breath at rest and with exertion   Problem: Activity: Goal: Risk for activity intolerance will decrease Outcome: Progressing   Problem: Elimination: Goal: Will not experience complications related to bowel motility Outcome: Progressing   Problem: Safety: Goal: Ability to remain free from injury will improve Outcome: Progressing

## 2018-07-11 NOTE — ED Triage Notes (Signed)
Pt endorses productive cough with brownish sputum x 2.5 weeks.  He also reports SOB, cp and abd pain.  Hx of CHF.

## 2018-07-11 NOTE — Progress Notes (Signed)
Pt received to room 3e20 from ED, pt alert and oriented, box 23 placed and confirmed with CCMD, denies cp, ra 95% but reports feeling sob and nurse in report says he gets DOE and sats would drop to mid 80's in ED, 02 2l/[place] applied as rr was 26 and after 02 and rest his RR was 20, paged Dr Sharyon Medicus as order for k was iv central line and pt has peripheral iv and asks for po, pt has been NPO, advised u/s of abdomen ordered and they just called to get him in next 30 min, educated need to stay NPO until afger scan, verbalized understanding

## 2018-07-11 NOTE — Progress Notes (Signed)
Report received from ED nurse, k 3.1, no replacment order noted in [facility] and nurse reports she did not give any k replacement, paged Dr Sharyon Medicus to advise as pt received lasix 80iv in ED earlier

## 2018-07-12 ENCOUNTER — Inpatient Hospital Stay (HOSPITAL_COMMUNITY): Payer: Medicaid Other

## 2018-07-12 ENCOUNTER — Inpatient Hospital Stay: Payer: Self-pay

## 2018-07-12 DIAGNOSIS — I5084 End stage heart failure: Secondary | ICD-10-CM

## 2018-07-12 DIAGNOSIS — I5023 Acute on chronic systolic (congestive) heart failure: Secondary | ICD-10-CM

## 2018-07-12 LAB — BASIC METABOLIC PANEL
Anion gap: 12 (ref 5–15)
BUN: 16 mg/dL (ref 6–20)
CO2: 28 mmol/L (ref 22–32)
Calcium: 8.7 mg/dL — ABNORMAL LOW (ref 8.9–10.3)
Chloride: 100 mmol/L (ref 98–111)
Creatinine, Ser: 1.4 mg/dL — ABNORMAL HIGH (ref 0.61–1.24)
GFR calc Af Amer: >60 mL/min (ref >60)
GFR calc non Af Amer: 59 mL/min — ABNORMAL LOW (ref >60)
Glucose, Bld: 103 mg/dL — ABNORMAL HIGH (ref 70–99)
Potassium: 3.1 mmol/L — ABNORMAL LOW (ref 3.5–5.1)
Sodium: 140 mmol/L (ref 135–145)

## 2018-07-12 LAB — COOXEMETRY PANEL
Carboxyhemoglobin: 1.4 % (ref 0.5–1.5)
Methemoglobin: 0.9 % (ref 0.0–1.5)
O2 Saturation: 58.1 %
Total hemoglobin: 15 g/dL (ref 12.0–16.0)

## 2018-07-12 LAB — HIV ANTIBODY (ROUTINE TESTING W REFLEX): HIV Screen 4th Generation wRfx: NONREACTIVE

## 2018-07-12 LAB — MAGNESIUM: Magnesium: 1.8 mg/dL (ref 1.7–2.4)

## 2018-07-12 MED ORDER — MILRINONE LACTATE IN DEXTROSE 20-5 MG/100ML-% IV SOLN
0.2500 ug/kg/min | INTRAVENOUS | Status: DC
  Administered 2018-07-12 – 2018-07-13 (×2): 0.25 ug/kg/min via INTRAVENOUS
  Filled 2018-07-12 (×2): qty 100

## 2018-07-12 MED ORDER — POTASSIUM CHLORIDE CRYS ER 20 MEQ PO TBCR
40.0000 meq | EXTENDED_RELEASE_TABLET | Freq: Once | ORAL | Status: AC
  Administered 2018-07-12: 40 meq via ORAL
  Filled 2018-07-12: qty 2

## 2018-07-12 MED ORDER — FUROSEMIDE 10 MG/ML IJ SOLN
40.0000 mg | Freq: Once | INTRAMUSCULAR | Status: AC
  Administered 2018-07-12: 40 mg via INTRAVENOUS
  Filled 2018-07-12: qty 4

## 2018-07-12 MED ORDER — SODIUM CHLORIDE 0.9% FLUSH
10.0000 mL | INTRAVENOUS | Status: DC | PRN
  Administered 2018-07-18: 10 mL
  Filled 2018-07-12: qty 40

## 2018-07-12 MED ORDER — POTASSIUM CHLORIDE CRYS ER 20 MEQ PO TBCR
40.0000 meq | EXTENDED_RELEASE_TABLET | Freq: Two times a day (BID) | ORAL | Status: DC
  Administered 2018-07-12: 40 meq via ORAL
  Filled 2018-07-12: qty 2

## 2018-07-12 MED ORDER — POTASSIUM CHLORIDE CRYS ER 20 MEQ PO TBCR
40.0000 meq | EXTENDED_RELEASE_TABLET | Freq: Two times a day (BID) | ORAL | Status: AC
  Administered 2018-07-12: 40 meq via ORAL
  Filled 2018-07-12: qty 2

## 2018-07-12 MED ORDER — CLONAZEPAM 0.5 MG PO TABS
0.5000 mg | ORAL_TABLET | Freq: Two times a day (BID) | ORAL | Status: DC | PRN
  Administered 2018-07-12 – 2018-07-19 (×8): 0.5 mg via ORAL
  Filled 2018-07-12 (×8): qty 1

## 2018-07-12 MED ORDER — MAGNESIUM SULFATE 2 GM/50ML IV SOLN
2.0000 g | Freq: Once | INTRAVENOUS | Status: AC
  Administered 2018-07-12: 2 g via INTRAVENOUS
  Filled 2018-07-12: qty 50

## 2018-07-12 MED ORDER — FUROSEMIDE 10 MG/ML IJ SOLN
80.0000 mg | Freq: Two times a day (BID) | INTRAMUSCULAR | Status: AC
  Administered 2018-07-12 – 2018-07-14 (×5): 80 mg via INTRAVENOUS
  Filled 2018-07-12 (×5): qty 8

## 2018-07-12 MED ORDER — DIGOXIN 125 MCG PO TABS
0.1250 mg | ORAL_TABLET | Freq: Every day | ORAL | Status: DC
  Administered 2018-07-12 – 2018-07-19 (×8): 0.125 mg via ORAL
  Filled 2018-07-12 (×8): qty 1

## 2018-07-12 MED ORDER — SPIRONOLACTONE 12.5 MG HALF TABLET
12.5000 mg | ORAL_TABLET | Freq: Every day | ORAL | Status: DC
  Administered 2018-07-12 – 2018-07-13 (×2): 12.5 mg via ORAL
  Filled 2018-07-12 (×2): qty 1

## 2018-07-12 NOTE — Progress Notes (Signed)
Pt stated he "couldn't get any air". Pt was laying flat in bed prior to calling. Pt sitting on edge of bed when RN came in to check on patient. Pt with tachypnea, orthopnea, and hx of sleep apnea. Pt states he has been previously unable to tolerate CPAP. Lung sounds clear. VS stable. Pt requested Ativan and soda. Pt's RN made aware.

## 2018-07-12 NOTE — Consult Note (Addendum)
Advanced Heart Failure Team Consult Note   Primary Physician: Patient, No Pcp Per PCP-Cardiologist: Dr Julio Almeen [facility]) Reason for Consultation: A/C systolic HF  HPI:    Paul Mccall is seen today for evaluation of A/C systolic HF at the request of Dr Renford DillsAdhikari.   Paul Mccall is a 48 y.o. male with a history of chronic systolic heart failure (EF 30-35%) due to NICM, HTN, bipolar disorder, cocaine abuse, tobacco abuse, and medication noncompliance.   He was seen in [facility] 7 times in 2019 with CP +/- volume overload associated with medication noncompliance and ongoing cocaine use. Typically given IV lasix with improvement.   Last seen in clinic by his cardiologist Dr Julio Almeen [facility] 01/[facility]. He was given IV lasix in clinic with 15 lb weight gain. Declined admission. He had been taking lasix incorrectly. He did not go to follow up. Weight was 194 lbs at that time.   He was admitted to [facility] 06/01/18-06/09/18 with A/C volume overload. He had not been taking medications due to confusion and cost. He had used cocaine within the week. EKG showed new LBBB at that time. Troponin was negative. Thought to be secondary to worsening CM and not ischemia. Diuresed with IV lasix. Advanced HF team consulted. Added lisinopril and spiro to his regimen. RHC was completed (see below). He was discharged on lasix 40 mg BID. DC weight 156 lbs. He was also treated for tooth abscess with amoxicillin.   He has had ongoing abdominal pain since early January. He was initially urinating a lot with PO lasix at home, but has dropped off over the last week. He moved to [place] 2-4 weeks ago to stay at a Motel because it is cheaper here. He has been taking lasix and lisinopril at home. Continues to use cocaine. Last time 3 days ago. Poor appetite. Very fatigued. +cough with brown/yellow sputum. No fever or chills. +orthopnea. +PND. No peripheral edema. He has CP about once/week over the last several years. It is left sided.  Seems to occur when he is anxious. Lasts about 1 minute. No associated symptoms. He does not have a scale, but weight is unchanged from recent DC weight.   SH: Living in a motel in [place]. Able to get medications, but does not have insurance. Car is close to breaking down, so transportation is an issue. Ongoing cocaine use since he was 17. He had quit for about 1 year, but relapsed 6 months ago. No ETOH use. Rare tobacco use. No other drug use. No PCP.  FH: His father had an MI at 48 years old and has CHF. No other family hx of CAD, CHF, or SCD.  [facility] with worsening SOB at rest, CP, and abdominal pain. Pertinent admission labs include: BNP 1099 (no previous), Na 140, K 3.1, creatinine 1.28, WBC 12.8, hemoglobin 13.8, troponin 0.03 CXR: CM and mild chronic ILD EKG: Sinus tach 103 bpm with LBBB (he had LBBB on last EKG at [facility] 06/[facility], but QRS was narrow in 02/2017). Abdominal US: negative  He was given 40 mg IV lasix x 2 with modest diuresis. Creatinine 1.40, K 3.1 today. AST 52 and ALT 63. He is in sinus tach 110s. SBP 100s. 5 beats NSVT on tele.  He still feels SOB. No CP currently. Worried he is going to die.   RHC 06/09/18: Hemodynamics (mmHg) RA mean 2 RV 39/7 PA 37/14 (24) PCWP 14 Cardiac Output (Fick) 4.1 Cardiac Index (Fick)  2.24 PVR (MAP-CVP/CO) x 80: 195 SVR 1376  LHC 12/14/2016: no angiographic evidence of significant coaronary disease  Echo 09/2017: EF 30-35%, mild MR, normal RV  Echo 08/2016: EF 35%, normal RV  Review of Systems: [y] = yes, [ ]  = no   . General: Weight gain [ ] ; Weight loss [ ] ; Anorexia Cove.Etienne[y ]; Fatigue Cove.Etienne[y ]; Fever [ ] ; Chills [ ] ; Weakness [ ]   . Cardiac: Chest pain/pressure Cove.Etienne[y ]; Resting SOB Cove.Etienne[y ]; Exertional SOB Cove.Etienne[y ]; Orthopnea Cove.Etienne[y ]; Pedal Edema [ ] ; Palpitations [ ] ; Syncope [ ] ; Presyncope [ ] ; Paroxysmal nocturnal dyspnea[y ]  . Pulmonary: Cough Cove.Etienne[y ]; Wheezing[ ] ; Hemoptysis[ ] ; Sputum [ y]; Snoring [ ]   . GI: Vomiting[ ] ;  Dysphagia[ ] ; Melena[ ] ; Hematochezia [ ] ; Heartburn[ ] ; Abdominal pain Cove.Etienne[y ]; Constipation [ ] ; Diarrhea [ ] ; BRBPR [ ]   . GU: Hematuria[ ] ; Dysuria [ ] ; Nocturia[ ]   . Vascular: Pain in legs with walking [ ] ; Pain in feet with lying flat [ ] ; Non-healing sores [ ] ; Stroke [ ] ; TIA [ ] ; Slurred speech [ ] ;  . Neuro: Headaches[ ] ; Vertigo[ ] ; Seizures[ ] ; Paresthesias[ ] ;Blurred vision [ ] ; Diplopia [ ] ; Vision changes [ ]   . Ortho/Skin: Arthritis [ ] ; Joint pain [ ] ; Muscle pain [ ] ; Joint swelling [ ] ; Back Pain [ ] ; Rash [ ]   . Psych: Depression[ ] ; Anxiety[ ]   . Heme: Bleeding problems [ ] ; Clotting disorders [ ] ; Anemia [ ]   . Endocrine: Diabetes [ ] ; Thyroid dysfunction[ ]   Home Medications Prior to Admission medications   Medication Sig Start Date End Date Taking? Authorizing Provider  furosemide (LASIX) 40 MG tablet Take 40 mg by mouth 2 (two) times daily. 04/16/18 06/09/19 Yes [provider]  lisinopril (PRINIVIL,ZESTRIL) 5 MG tablet Take 5 mg by mouth daily. 04/16/18 06/10/19 Yes [provider]  ondansetron (ZOFRAN) 4 MG tablet Take 1 tablet (4 mg total) by mouth every 8 (eight) hours as needed for nausea or vomiting. Patient not taking: Reported on 07/12/2018 02/23/17   Jeanmarie PlantMcShane, James A, MD    Past Medical History: Past Medical History:  Diagnosis Date  . CHF (congestive heart failure) (HCC)   . Dental infection   . Enlarged heart   . Hernia, inguinal, right   . Hypertension     Past Surgical History: History reviewed. No pertinent surgical history.  Family History: No family history on file.  Social History: Social History   Socioeconomic History  . Marital status: Married    Spouse name: Not on file  . Number of children: Not on file  . Years of education: Not on file  . Highest education level: Not on file  Occupational History  . Not on file  Social Needs  . Financial resource strain: Not on file  . Food insecurity:    Worry: Not on file     Inability: Not on file  . Transportation needs:    Medical: Not on file    Non-medical: Not on file  Tobacco Use  . Smoking status: Current Every Day Smoker  . Smokeless tobacco: Never Used  Substance and Sexual Activity  . Alcohol use: Yes    Comment: occasionally  . Drug use: Not on file  . Sexual activity: Not on file  Lifestyle  . Physical activity:    Days per week: Not on file    Minutes per session: Not on file  . Stress: Not on  file  Relationships  . Social connections:    Talks on phone: Not on file    Gets together: Not on file    Attends religious service: Not on file    Active member of club or organization: Not on file    Attends meetings of clubs or organizations: Not on file    Relationship status: Not on file  Other Topics Concern  . Not on file  Social History Narrative  . Not on file    Allergies:  No Known Allergies  Objective:    Vital Signs:   Temp:  [97.1 F (36.2 C)-98.4 F (36.9 C)] 97.2 F (36.2 C) (02/03 2333) Pulse Rate:  [103-109] 106 (02/04 0907) Resp:  [16-26] 26 (02/04 0307) BP: (101-131)/(70-108) 107/94 (02/04 0907) SpO2:  [88 %-100 %] 99 % (02/04 0907) Weight:  [70.8 kg] 70.8 kg (02/03 1851) Last BM Date: 07/11/18  Weight change: Filed Weights   07/11/18 1851  Weight: 70.8 kg    Intake/Output:   Intake/Output Summary (Last 24 hours) at 07/12/2018 1037 Last data filed at 07/12/2018 0959 Gross per 24 hour  Intake 1151 ml  Output 1550 ml  Net -399 ml      Physical Exam    General:  Appears fatigued. SOB with talking. HEENT: normal Neck: supple. JVP to jaw. Carotids 2+ bilat; no bruits. No lymphadenopathy or thyromegaly appreciated. Cor: PMI nondisplaced. Tachy, regular. +s3 Lungs: crackles in bases Abdomen: soft, nondistended, tender RUQ. No hepatosplenomegaly. No bruits or masses. Good bowel sounds. Extremities: no cyanosis, clubbing, rash, edema. Cool to extremities Neuro: alert & orientedx3, cranial nerves grossly  intact. moves all 4 extremities w/o difficulty. Affect pleasant   Telemetry   Sinus tach with LBBB 110s. 5 beats NSVT. Personally reviewed.   EKG    Sinus tach 103 bpm with LBBB. Personally reviewed.   Labs   Basic Metabolic Panel: Recent Labs  Lab 07/11/18 1411 07/12/18 0455  NA 140 140  K 3.1* 3.1*  CL 103 100  CO2 23 28  GLUCOSE 147* 103*  BUN 15 16  CREATININE 1.28* 1.40*  CALCIUM 9.0 8.7*    Liver Function Tests: Recent Labs  Lab 07/11/18 1724  AST 52*  ALT 63*  ALKPHOS 107  BILITOT 0.6  PROT 6.8  ALBUMIN 3.2*   Recent Labs  Lab 07/11/18 1724  LIPASE 32   No results for input(s): AMMONIA in the last 168 hours.  CBC: Recent Labs  Lab 07/11/18 1411  WBC 12.9*  HGB 13.8  HCT 44.3  MCV 89.0  PLT 327    Cardiac Enzymes: No results for input(s): CKTOTAL, CKMB, CKMBINDEX, TROPONINI in the last 168 hours.  BNP: BNP (last 3 results) Recent Labs    07/11/18 1411  BNP 1,099.3*    ProBNP (last 3 results) No results for input(s): PROBNP in the last 8760 hours.   CBG: No results for input(s): GLUCAP in the last 168 hours.  Coagulation Studies: No results for input(s): LABPROT, INR in the last 72 hours.   Imaging   Dg Chest 2 View  Result Date: 07/11/2018 CLINICAL DATA:  Chest pain and shortness of breath today. EXAM: CHEST - 2 VIEW COMPARISON:  None. FINDINGS: Enlarged cardiac silhouette. Clear lungs with normal vasculature. Mild diffuse prominence of the interstitial markings. No pleural fluid. The bones appear osteopenic. IMPRESSION: Cardiomegaly and mild chronic interstitial lung disease. Electronically Signed   By: Beckie Salts M.D.   On: 07/11/2018 14:55   US Abdomen Complete  Result Date: 07/11/2018 CLINICAL DATA:  Upper abdominal pain. EXAM: ABDOMEN ULTRASOUND COMPLETE COMPARISON:  None. FINDINGS: Gallbladder: No gallstones or wall thickening visualized. No sonographic Murphy sign noted by sonographer. Common bile duct: Diameter: 3.3  mm Liver: No focal lesion identified. Within normal limits in parenchymal echogenicity. Portal vein is patent on color Doppler imaging with normal direction of blood flow towards the liver. IVC: No abnormality visualized. Pancreas: Visualized portion unremarkable. Spleen: Size and appearance within normal limits. Right Kidney: Length: 11.5 x 4.5 x 5.6 cm. Echogenicity within normal limits. No mass or hydronephrosis visualized. Left Kidney: Length: 10.6 x 6.1 x 4.9 cm. Echogenicity within normal limits. No mass or hydronephrosis visualized. Abdominal aorta: No aneurysm visualized. Other findings: None. IMPRESSION: No cause for pain identified. Electronically Signed   By: Gerome Sam III M.D   On: 07/11/2018 20:23      Medications:     Current Medications: . enoxaparin (LOVENOX) injection  40 mg Subcutaneous Q24H  . furosemide  40 mg Intravenous BID  . potassium chloride  40 mEq Oral BID  . sodium chloride flush  3 mL Intravenous Once  . sodium chloride flush  3 mL Intravenous Q12H     Infusions: . sodium chloride         Patient Profile   Paul Mccall is a 48 y.o. male with a history of chronic systolic heart failure (EF30%) due to NICM, HTN, bipolar disorder, cocaine abuse, tobacco abuse, and medication noncompliance.   Admitted with SOB, CP, and abdominal pain.   Assessment/Plan   1. Acute on chronic systolic HF due to NICM. No ICD.  - LHC 2018 no significant coronary disease - Echo 09/2017: EF 30-35% - Recent RHC (06/09/18) with CO 4.1, CI 2.24 - Volume status elevated. I am worried he is low output.  - Increase lasix to 80 mg IV BID - Start spiro 12.5 mg daily - Start digoxin 0.125 mg daily - SBP 100s. Will plan to add back lisinopril tomorrow if BP okay. - No BB with concern for low output - Repeat echo - Start milrinone 0.25 mcg/kg/min empirically. Place PICC line for CVP/coox monitoring. He would not be a home inotrope or advanced therapies candidate.  - Would  eventually qualify for CRT-D if he is able to stop cocaine and be compliant with medications/follow up.   2. CP - LHC 2018 with no significant CAD - Troponin 0.03. - EKG with LBBB. New as of 06/09/18. - He says he has had CP 1x/week for several years. Seems to be associated with stress. No associated symptoms.   3. LBBB - New as of 06/2018. Has not had new ischemic work up. Troponin 0.03. Thought to be secondary to worsening cardiomyopathy when evaluated at [facility].  4. Abdominal pain - Likely to low output. Abdominal US negative.  5. Cocaine abuse - Last use 2/1. Discussed abstinence.  6. Tobacco use - Smokes 1-2 cigarettes/week  7. Bipolar disorder - Likely contributing to a lot of his problems. He is not currently taking any medications. Says he stopped when he started using cocaine again - He needs to be set up with PCP  8. NSVT - K 3.1. Supp. - Add on mag   Medication concerns reviewed with patient and pharmacy team. Barriers identified: compliance, drug abuse, no PCP, transportation issues, unsure where he will be living longterm.   Length of Stay: 1  Alford Highland, NP  07/12/2018, 10:37 AM  Advanced Heart Failure Team Pager [phone] (M-F; 7a -  4p)  Please contact [facility] for night-coverage after hours (4p -7a ) and weekends on [email or web address]  Patient seen and examined with the above-signed Advanced Practice Provider and/or Housestaff. I personally reviewed laboratory data, imaging studies and relevant notes. I independently examined the patient and formulated the important aspects of the plan. I have edited the note to reflect any of my changes or salient points. I have personally discussed the plan with the patient and/or family.  48 y/o male with long h/o cocaine and tobacco abuse as well as severe systolic HF due to NICM. Ef previously 30-35% (suspect lower now). Has been seen multiple times at [facility] and [facility] but relatively non-compliant with f/u. Now living in a  hotel in [place] with his fiance (who is also using cocaine). Presents with Class IV HF with marked volume overload and low output signs/symptoms.  On exam JVP to ear. Tachy regular. Prominent s3. Liver congested and pulsatile. Extremities cool Mild edema  Long talk about the fact that he is nearing death due to end-stage HF if he doesn't change his ways. Likely needs advanced therapies (or at least CRT-D) but he is not candidate with lack of f/u and ongoing substance use. Will place PICC and start milrinone to support diuresis. Will have SW see as well.   Arvilla Meres, MD  2:23 PM

## 2018-07-12 NOTE — Progress Notes (Signed)
PROGRESS NOTE    Paul Mccall  YDX:[id number] DOB: Jun 15, 1970 DOA: 07/11/2018 PCP: Paul Mccall, No Pcp Per   Brief Narrative: Paul Mccall is a 48 year old male with history of severe nonischemic cardiomyopathy with ejection fraction of 20%, cocaine abuse , noncompliance who presents to the emergency department with complaints of chest pain or shortness of breath.  Paul Mccall had a cardiac cath on 06/09/2018 with pain which showed an ejection fraction of less than 20%.  He just moved to [place].  He was also complaining of right upper quadrant pain.  Found to have elevated BNP on presentation.  Chest ray showed cardiomegaly.  Cardiology consulted for severe congestive heart failure.   Assessment & Plan:   Active Problems:   Congestive heart failure (CHF) (HCC)   Acute on congestive heart failure: History of systolic heart failure thought to be secondary to nonischemic cardiomyopathy.  Ejection fraction of 20%  as per  cardiac cath on 2/20 at [facility].  Cardiology consulted and following.  Continued on IV Lasix.  Continue input/output monitoring.  Elevated  BNP on presentation. Also started on milrinone, spironolactone and digoxin.  Echocardiogram has been ordered.  Hypertension: Currently blood pressure stable.  Chest pain/Right upper quadrant pain: Chest pain has resolved.  Abdomen ultrasound did not show any cholelithiasis or any signs of cholecystitis.  Supportive care.  Abdomen pain is improved.  CKD stage II: Currently stable.  Monitor with diuresis.  Hypokalemia: Supplemented with potassium.Will check BMP tomorrow.  Cocaine abuse/smoker: Snorts/smokes cocaine.  Counseled for cessation.  Bipolar disorder/anxiety disorder: Continue Klonopin for now.         DVT prophylaxis: Lovenox Code Status: Full Family Communication: Wife present at bedside Disposition Plan: Home after stabilization of CHF status   Consultants: Cardiology  Procedures: None  Antimicrobials:  Anti-infectives  (From admission, onward)   None      Subjective: Paul Mccall seen and examined at bedside this morning.  Found to be extremely anxious.  Dyspneic.  Chest pain has resolved.  Objective: Vitals:   07/11/18 2333 07/12/18 0307 07/12/18 0907 07/12/18 1226  BP: 125/90 101/70 (!) 107/94 106/83  Pulse: (!) 109 (!) 109 (!) 106 (!) 107  Resp: (!) 22 (!) 26  (!) 24  Temp: (!) 97.2 F (36.2 C)   97.6 F (36.4 C)  TempSrc:    Oral  SpO2: 98% 100% 99% 97%  Weight:      Height:        Intake/Output Summary (Last 24 hours) at 07/12/2018 1305 Last data filed at 07/12/2018 1228 Gross per 24 hour  Intake 1151 ml  Output 2000 ml  Net -849 ml   Filed Weights   07/11/18 1851  Weight: 70.8 kg    Examination:  General exam: Generalized weakness, anxious  HEENT:PERRL,Oral mucosa moist, Ear/Nose normal on gross exam Respiratory system: Bilateral decreased air entry on the bases Cardiovascular system: Elevated JVD , No murmurs, rubs, gallops or clicks. No pedal edema. Gastrointestinal system: Abdomen is nondistended, soft and nontender. No organomegaly or masses felt. Normal bowel sounds heard. Central nervous system: Alert and oriented. No focal neurological deficits. Extremities: No edema, no clubbing ,no cyanosis, distal peripheral pulses palpable. Skin: No rashes, lesions or ulcers,no icterus ,no pallor MSK: Normal muscle bulk,tone ,power Psychiatry: Anxious    Data Reviewed: I have personally reviewed following labs and imaging studies  CBC: Recent Labs  Lab 07/11/18 1411  WBC 12.9*  HGB 13.8  HCT 44.3  MCV 89.0  PLT 327   Basic Metabolic Panel: Recent  Labs  Lab 07/11/18 1411 07/12/18 0455  NA 140 140  K 3.1* 3.1*  CL 103 100  CO2 23 28  GLUCOSE 147* 103*  BUN 15 16  CREATININE 1.28* 1.40*  CALCIUM 9.0 8.7*  MG  --  1.8   GFR: Estimated Creatinine Clearance: 63.1 mL/min (A) (by C-G formula based on SCr of 1.4 mg/dL (H)). Liver Function Tests: Recent Labs  Lab  07/11/18 1724  AST 52*  ALT 63*  ALKPHOS 107  BILITOT 0.6  PROT 6.8  ALBUMIN 3.2*   Recent Labs  Lab 07/11/18 1724  LIPASE 32   No results for input(s): AMMONIA in the last 168 hours. Coagulation Profile: No results for input(s): INR, PROTIME in the last 168 hours. Cardiac Enzymes: No results for input(s): CKTOTAL, CKMB, CKMBINDEX, TROPONINI in the last 168 hours. BNP (last 3 results) No results for input(s): PROBNP in the last 8760 hours. HbA1C: No results for input(s): HGBA1C in the last 72 hours. CBG: No results for input(s): GLUCAP in the last 168 hours. Lipid Profile: No results for input(s): CHOL, HDL, LDLCALC, TRIG, CHOLHDL, LDLDIRECT in the last 72 hours. Thyroid Function Tests: No results for input(s): TSH, T4TOTAL, FREET4, T3FREE, THYROIDAB in the last 72 hours. Anemia Panel: No results for input(s): VITAMINB12, FOLATE, FERRITIN, TIBC, IRON, RETICCTPCT in the last 72 hours. Sepsis Labs: No results for input(s): PROCALCITON, LATICACIDVEN in the last 168 hours.  No results found for this or any previous visit (from the past 240 hour(s)).       Radiology Studies: Dg Chest 2 View  Result Date: 07/11/2018 CLINICAL DATA:  Chest pain and shortness of breath today. EXAM: CHEST - 2 VIEW COMPARISON:  None. FINDINGS: Enlarged cardiac silhouette. Clear lungs with normal vasculature. Mild diffuse prominence of the interstitial markings. No pleural fluid. The bones appear osteopenic. IMPRESSION: Cardiomegaly and mild chronic interstitial lung disease. Electronically Signed   By: Beckie SaltsSteven  Reid M.D.   On: 07/11/2018 14:55   [place] Abdomen Complete  Result Date: 07/11/2018 CLINICAL DATA:  Upper abdominal pain. EXAM: ABDOMEN ULTRASOUND COMPLETE COMPARISON:  None. FINDINGS: Gallbladder: No gallstones or wall thickening visualized. No sonographic Murphy sign noted by sonographer. Common bile duct: Diameter: 3.3 mm Liver: No focal lesion identified. Within normal limits in parenchymal  echogenicity. Portal vein is patent on color Doppler imaging with normal direction of blood flow towards the liver. IVC: No abnormality visualized. Pancreas: Visualized portion unremarkable. Spleen: Size and appearance within normal limits. Right Kidney: Length: 11.5 x 4.5 x 5.6 cm. Echogenicity within normal limits. No mass or hydronephrosis visualized. Left Kidney: Length: 10.6 x 6.1 x 4.9 cm. Echogenicity within normal limits. No mass or hydronephrosis visualized. Abdominal aorta: No aneurysm visualized. Other findings: None. IMPRESSION: No cause for pain identified. Electronically Signed   By: Gerome Samavid  Williams III M.D   On: 07/11/2018 20:23   [place] Ekg Site Rite  Result Date: 07/12/2018 If Site Rite image not attached, placement could not be confirmed due to current cardiac rhythm.       Scheduled Meds: . digoxin  0.125 mg Oral Daily  . enoxaparin (LOVENOX) injection  40 mg Subcutaneous Q24H  . furosemide  40 mg Intravenous Once  . furosemide  80 mg Intravenous BID  . potassium chloride  40 mEq Oral BID  . potassium chloride  40 mEq Oral Once  . sodium chloride flush  3 mL Intravenous Once  . sodium chloride flush  3 mL Intravenous Q12H  . spironolactone  12.5 mg  Oral QHS   Continuous Infusions: . sodium chloride    . magnesium sulfate 1 - 4 g bolus IVPB    . milrinone       LOS: 1 day    Time spent: 35 mins.More than 50% of that time was spent in counseling and/or coordination of care.      Burnadette Pop, MD [facility] Pager [phone]  If 7PM-7AM, please contact night-coverage [email or web address] Password [email or web address] 07/12/2018, 1:05 PM

## 2018-07-12 NOTE — Progress Notes (Signed)
Peripherally Inserted Central Catheter/Midline Placement  The IV Nurse has discussed with the patient and/or persons authorized to consent for the patient, the purpose of this procedure and the potential benefits and risks involved with this procedure.  The benefits include less needle sticks, lab draws from the catheter, and the patient may be discharged home with the catheter. Risks include, but not limited to, infection, bleeding, blood clot (thrombus formation), and puncture of an artery; nerve damage and irregular heartbeat and possibility to perform a PICC exchange if needed/ordered by physician.  Alternatives to this procedure were also discussed.  Bard Power PICC patient education guide, fact sheet on infection prevention and patient information card has been provided to patient /or left at bedside.    PICC/Midline Placement Documentation  PICC Double Lumen 07/12/18 Right Basilic 24 cm 0 cm (Active)  Indication for Insertion or Continuance of Line Prolonged intravenous therapies 07/12/2018  2:52 PM  Exposed Catheter (cm) 0 cm 07/12/2018  2:52 PM  Site Assessment Clean;Dry;Intact 07/12/2018  2:52 PM  Lumen #1 Status Flushed;Blood return noted;Saline locked 07/12/2018  2:52 PM  Lumen #2 Status Flushed;Blood return noted;Saline locked 07/12/2018  2:52 PM  Dressing Type Transparent;Securing device 07/12/2018  2:52 PM  Dressing Status Clean;Dry;Intact;Antimicrobial disc in place 07/12/2018  2:52 PM  Dressing Change Due 07/19/18 07/12/2018  2:52 PM       Romie Jumper 07/12/2018, 2:55 PM

## 2018-07-12 NOTE — Progress Notes (Signed)
PT. With 5 beats of V-Tach. Pt. Resting in bed. No distress noted. On call for [facility] paged to make aware. RN will continue to monitor. Monti Jilek, Cheryll Dessert

## 2018-07-13 ENCOUNTER — Inpatient Hospital Stay (HOSPITAL_COMMUNITY): Payer: Medicaid Other

## 2018-07-13 DIAGNOSIS — I428 Other cardiomyopathies: Secondary | ICD-10-CM

## 2018-07-13 DIAGNOSIS — I361 Nonrheumatic tricuspid (valve) insufficiency: Secondary | ICD-10-CM

## 2018-07-13 LAB — CBC
HEMATOCRIT: 44.8 % (ref 39.0–52.0)
Hemoglobin: 14.3 g/dL (ref 13.0–17.0)
MCH: 28.1 pg (ref 26.0–34.0)
MCHC: 31.9 g/dL (ref 30.0–36.0)
MCV: 88 fL (ref 80.0–100.0)
Platelets: 359 10*3/uL (ref 150–400)
RBC: 5.09 MIL/uL (ref 4.22–5.81)
RDW: 13.8 % (ref 11.5–15.5)
WBC: 12.2 10*3/uL — ABNORMAL HIGH (ref 4.0–10.5)
nRBC: 0 % (ref 0.0–0.2)

## 2018-07-13 LAB — MAGNESIUM: Magnesium: 2.2 mg/dL (ref 1.7–2.4)

## 2018-07-13 LAB — BASIC METABOLIC PANEL
ANION GAP: 11 (ref 5–15)
BUN: 17 mg/dL (ref 6–20)
CO2: 28 mmol/L (ref 22–32)
Calcium: 8.9 mg/dL (ref 8.9–10.3)
Chloride: 98 mmol/L (ref 98–111)
Creatinine, Ser: 1.22 mg/dL (ref 0.61–1.24)
GFR calc non Af Amer: >60 mL/min (ref >60)
Glucose, Bld: 109 mg/dL — ABNORMAL HIGH (ref 70–99)
Potassium: 3.7 mmol/L (ref 3.5–5.1)
Sodium: 137 mmol/L (ref 135–145)

## 2018-07-13 LAB — ECHOCARDIOGRAM COMPLETE
Height: 68 in
Weight: 2468.8 oz

## 2018-07-13 LAB — COOXEMETRY PANEL
Carboxyhemoglobin: 1.1 % (ref 0.5–1.5)
Methemoglobin: 1.4 % (ref 0.0–1.5)
O2 Saturation: 51.5 %
Total hemoglobin: 14.8 g/dL (ref 12.0–16.0)

## 2018-07-13 LAB — HEPARIN LEVEL (UNFRACTIONATED): Heparin Unfractionated: <0.1 IU/mL — ABNORMAL LOW (ref 0.30–0.70)

## 2018-07-13 MED ORDER — LOSARTAN POTASSIUM 25 MG PO TABS
12.5000 mg | ORAL_TABLET | Freq: Two times a day (BID) | ORAL | Status: DC
  Administered 2018-07-13 – 2018-07-16 (×6): 12.5 mg via ORAL
  Filled 2018-07-13 (×6): qty 1

## 2018-07-13 MED ORDER — MILRINONE LACTATE IN DEXTROSE 20-5 MG/100ML-% IV SOLN
0.3750 ug/kg/min | INTRAVENOUS | Status: DC
  Administered 2018-07-13 – 2018-07-15 (×4): 0.375 ug/kg/min via INTRAVENOUS
  Filled 2018-07-13 (×4): qty 100

## 2018-07-13 MED ORDER — POTASSIUM CHLORIDE CRYS ER 20 MEQ PO TBCR
60.0000 meq | EXTENDED_RELEASE_TABLET | Freq: Once | ORAL | Status: AC
  Administered 2018-07-13: 60 meq via ORAL
  Filled 2018-07-13: qty 3

## 2018-07-13 MED ORDER — HEPARIN BOLUS VIA INFUSION
2000.0000 [IU] | Freq: Once | INTRAVENOUS | Status: AC
  Administered 2018-07-13: 2000 [IU] via INTRAVENOUS
  Filled 2018-07-13: qty 2000

## 2018-07-13 MED ORDER — LOSARTAN POTASSIUM 25 MG PO TABS
12.5000 mg | ORAL_TABLET | Freq: Every day | ORAL | Status: DC
  Administered 2018-07-13: 12.5 mg via ORAL
  Filled 2018-07-13: qty 1

## 2018-07-13 MED ORDER — HEPARIN (PORCINE) 25000 UT/250ML-% IV SOLN
1550.0000 [IU]/h | INTRAVENOUS | Status: DC
  Administered 2018-07-13: 1200 [IU]/h via INTRAVENOUS
  Administered 2018-07-14: 1400 [IU]/h via INTRAVENOUS
  Administered 2018-07-14: 1650 [IU]/h via INTRAVENOUS
  Administered 2018-07-15 – 2018-07-19 (×4): 1550 [IU]/h via INTRAVENOUS
  Filled 2018-07-13 (×9): qty 250

## 2018-07-13 MED ORDER — HEPARIN BOLUS VIA INFUSION
4000.0000 [IU] | Freq: Once | INTRAVENOUS | Status: AC
  Administered 2018-07-13: 4000 [IU] via INTRAVENOUS
  Filled 2018-07-13: qty 4000

## 2018-07-13 NOTE — Progress Notes (Signed)
ANTICOAGULATION CONSULT NOTE  Pharmacy Consult for heparin Indication: LV Thrombus  No Known Allergies  Patient Measurements: Height: 5\' 8"  (172.7 cm) Weight: 154 lb 4.8 oz (70 kg) IBW/kg (Calculated) : 68.4 Heparin Dosing Weight: 70kg  Vital Signs: Temp: 97.7 F (36.5 C) (02/05 1948) Temp Source: Oral (02/05 1948) BP: 113/83 (02/05 1948) Pulse Rate: 100 (02/05 1948)  Labs: Recent Labs    07/11/18 1411 07/12/18 0455 07/13/18 0422 07/13/18 1830  HGB 13.8  --  14.3  --   HCT 44.3  --  44.8  --   PLT 327  --  359  --   HEPARINUNFRC  --   --   --  <0.10*  CREATININE 1.28* 1.40* 1.22  --     Estimated Creatinine Clearance: 72.4 mL/min (by C-G formula based on SCr of 1.22 mg/dL).   Medical History: Past Medical History:  Diagnosis Date  . CHF (congestive heart failure) (HCC)   . Dental infection   . Enlarged heart   . Hernia, inguinal, right   . Hypertension    Assessment: 25 yoM with advanced HF found to have LV thrombus on ECHO. Pharmacy to begin heparin infusion. No OAC PTA, pt received enoxaparin for DVT prophylaxis 2/4 at 1730.   Initial heparin level came back undetectable, on 1200 units/hr. CBC stable. No s/sx of bleeding. No infusion issues- heparin is running in PICC line.   Goal of Therapy:  Heparin level 0.3-0.7 units/ml Monitor platelets by anticoagulation protocol: Yes   Plan:  -Heparin 2000 units x1 then increase to 1400 units/hr -Daily heparin level and CBC -Will follow-up longterm oral anticoagulation plans  Sherron Monday, PharmD, BCCCP Clinical Pharmacist  Pager: [phone] Phone: [phone] Please check AMION for all [facility] numbers 07/13/2018

## 2018-07-13 NOTE — Clinical Social Work Note (Signed)
Clinical Social Work Assessment  Patient Details  Name: Paul Mccall MRN: [id number] Date of Birth: [birth year]  Date of referral:  07/13/18               Reason for consult:  Substance Use/ETOH Abuse, Housing Concerns/Homelessness, Insurance Barriers, Mental Health Concerns                Permission sought to share information with:  Family Supports Permission granted to share information::  Yes, Verbal Permission Granted  Name::        Agency::     Relationship::  fiance at bedside  Contact Information:     Housing/Transportation Living arrangements for the past 2 months:  Hotel/Motel Source of Information:  Patient Patient Interpreter Needed:  None Criminal Activity/Legal Involvement Pertinent to Current Situation/Hospitalization:  No - Comment as needed Significant Relationships:  Spouse Lives with:  Self, Spouse Do you feel safe going back to the place where you live?  Yes Need for family participation in patient care:  No (Coment)  Care giving concerns: CSW consulted for patient with cocaine use, living in hotel, no PCP and no insurance.    Social Worker assessment / plan: CSW met with patient and fiance at bedside. Patient alert and oriented and gave permission to discuss substance use with fiance present. CSW introduced self and role and discussed reason for consult.  Patient reported that he and fiance recently moved to [place] and are living in a hotel. They have a car that they are using but it is about to break down.   Patient and fiance reported that financial counselor had just visited and discussed Medicaid and disability applications.  Patient endorsed cocaine use, last use a few days before being admitted. Patient has been in treatment programs before, most recently in [facility] and a residential program at [facility] in [place] last year. Patient is interested in treatment. CSW provided list of inpatient and outpatient substance use treatment resources and  reviewed resources that often accept patients with no insurance.   Also provided transportation and shelter resources, as well as information on the [place]. Advised patient to reach out with additional questions.  Will sign off for now, please re-consult if additional needs arise. Can assist with transportation at discharge if needed.   Employment status:    Insurance information:  Self Pay (Medicaid Pending) PT Recommendations:  Not assessed at this time Information / Referral to community resources:  Residential Substance Abuse Treatment Options, Outpatient Substance Abuse Treatment Options, [facility] [place]  Patient/Family's Response to care: Patient and fiance appreciative of care.  Patient/Family's Understanding of and Emotional Response to Diagnosis, Current Treatment, and Prognosis: Patient and fiance with understanding of patient's condition.  Emotional Assessment Appearance:  Appears stated age Attitude/Demeanor/Rapport:  Engaged Affect (typically observed):  Accepting, Calm, Pleasant, Appropriate Orientation:  Oriented to Self, Oriented to Place, Oriented to  Time, Oriented to Situation Alcohol / Substance use:  Illicit Drugs Psych involvement (Current and /or in the community):  No (Comment)  Discharge Needs  Concerns to be addressed:  Substance Abuse Concerns, Homelessness Readmission within the last 30 days:    Current discharge risk:  Homeless, Chronically ill, Inadequate Financial Supports, Substance Abuse Barriers to Discharge:  Continued Medical Work up   [employer], LCSW 07/13/2018, 12:28 PM

## 2018-07-13 NOTE — Progress Notes (Signed)
PROGRESS NOTE    Paul Mccall  FBP:[id number] DOB: 01/29/1971 DOA: 07/11/2018 PCP: Patient, No Pcp Per   Brief Narrative: Patient is a 48 year old male with history of severe nonischemic cardiomyopathy with ejection fraction of 20%, cocaine abuse , noncompliance who presents to the emergency department with complaints of chest pain or shortness of breath.  Patient had a cardiac cath on 06/09/2018 with pain which showed an ejection fraction of less than 20%.  He just moved to [place].  He was also complaining of right upper quadrant pain.  Found to have elevated BNP on presentation.  Chest ray showed cardiomegaly.  Cardiology consulted for severe congestive heart failure.   Assessment & Plan:   Active Problems:   Congestive heart failure (CHF) (HCC)   Acute on congestive heart failure: History of systolic heart failure thought to be secondary to nonischemic cardiomyopathy.  Ejection fraction of 20%  as per  cardiac cath on 2/20 at [facility].  Cardiology consulted and following.  Continued on IV Lasix.  Continue input/output monitoring.  Elevated  BNP on presentation. Also started on milrinone, spironolactone and digoxin.  Echocardiogram done here showed ejection fraction of 15 to 20%.,  Dilated cardiomyopathy, grade 3 diastolic dysfunction.  Severe hypokinesis Having good diuresis. Plan is for CRT if he gives up drug abuse and noncompliance.  Apical thrombus: 2.6cm mobile thrombus noted at the apical lateral myocardium asper Echo.  Started on heparin drip.  Hypertension: Currently blood pressure stable.  Chest pain/Right upper quadrant pain: Chest pain has resolved.  Abdomen ultrasound did not show any cholelithiasis or any signs of cholecystitis.    Abdomen pain resolved.  CKD stage II: Currently stable.  Monitor with diuresis.  Hypokalemia: Supplemented with potassium.  Cocaine abuse/smoker: Snorts/smokes cocaine.  Counseled for cessation.  Bipolar disorder/anxiety disorder: Continue  Klonopin for now.  He needs to follow-up with psychiatry as an outpatient after discharge.         DVT prophylaxis: Lovenox Code Status: Full Family Communication: Wife present at bedside Disposition Plan: Home after stabilization of CHF status   Consultants: Cardiology  Procedures: None  Antimicrobials:  Anti-infectives (From admission, onward)   None      Subjective: Patient seen and examined the bedside this morning.  Remains comfortable and was sleeping.  He says he is feeling much better today.  Having good diuresis. Denies  Any abdominal pain or chest pain today.  Objective: Vitals:   07/13/18 0538 07/13/18 0935 07/13/18 0940 07/13/18 1154  BP: (!) 121/102  (!) 108/94 114/86  Pulse: (!) 113 (!) 114    Resp:   (!) 24 (!) 22  Temp: 98.1 F (36.7 C)  97.6 F (36.4 C) 98 F (36.7 C)  TempSrc: Oral  Oral Oral  SpO2: 98%   94%  Weight: 70 kg     Height: 5\' 8"  (1.727 m)       Intake/Output Summary (Last 24 hours) at 07/13/2018 1200 Last data filed at 07/13/2018 1156 Gross per 24 hour  Intake 1255.7 ml  Output 5725 ml  Net -4469.3 ml   Filed Weights   07/11/18 1851 07/13/18 0538  Weight: 70.8 kg 70 kg    Examination:   General exam: Comfortable HEENT:PERRL,Oral mucosa moist, Ear/Nose normal on gross exam Respiratory system: Bilateral decreased air entry on the bases Cardiovascular system: Elevated JVD, no murmurs, rubs, gallops or clicks. Gastrointestinal system: Abdomen is nondistended, soft and nontender. No organomegaly or masses felt. Normal bowel sounds heard. Central nervous system: Alert and oriented.  No focal neurological deficits. Extremities: No edema, no clubbing ,no cyanosis, distal peripheral pulses palpable. Skin: No rashes, lesions or ulcers,no icterus ,no pallor     Data Reviewed: I have personally reviewed following labs and imaging studies  CBC: Recent Labs  Lab 07/11/18 1411 07/13/18 0422  WBC 12.9* 12.2*  HGB 13.8 14.3  HCT  44.3 44.8  MCV 89.0 88.0  PLT 327 359   Basic Metabolic Panel: Recent Labs  Lab 07/11/18 1411 07/12/18 0455 07/13/18 0422  NA 140 140 137  K 3.1* 3.1* 3.7  CL 103 100 98  CO2 23 28 28   GLUCOSE 147* 103* 109*  BUN 15 16 17   CREATININE 1.28* 1.40* 1.22  CALCIUM 9.0 8.7* 8.9  MG  --  1.8 2.2   GFR: Estimated Creatinine Clearance: 72.4 mL/min (by C-G formula based on SCr of 1.22 mg/dL). Liver Function Tests: Recent Labs  Lab 07/11/18 1724  AST 52*  ALT 63*  ALKPHOS 107  BILITOT 0.6  PROT 6.8  ALBUMIN 3.2*   Recent Labs  Lab 07/11/18 1724  LIPASE 32   No results for input(s): AMMONIA in the last 168 hours. Coagulation Profile: No results for input(s): INR, PROTIME in the last 168 hours. Cardiac Enzymes: No results for input(s): CKTOTAL, CKMB, CKMBINDEX, TROPONINI in the last 168 hours. BNP (last 3 results) No results for input(s): PROBNP in the last 8760 hours. HbA1C: No results for input(s): HGBA1C in the last 72 hours. CBG: No results for input(s): GLUCAP in the last 168 hours. Lipid Profile: No results for input(s): CHOL, HDL, LDLCALC, TRIG, CHOLHDL, LDLDIRECT in the last 72 hours. Thyroid Function Tests: No results for input(s): TSH, T4TOTAL, FREET4, T3FREE, THYROIDAB in the last 72 hours. Anemia Panel: No results for input(s): VITAMINB12, FOLATE, FERRITIN, TIBC, IRON, RETICCTPCT in the last 72 hours. Sepsis Labs: No results for input(s): PROCALCITON, LATICACIDVEN in the last 168 hours.  No results found for this or any previous visit (from the past 240 hour(s)).       Radiology Studies: Dg Chest 2 View  Result Date: 07/11/2018 CLINICAL DATA:  Chest pain and shortness of breath today. EXAM: CHEST - 2 VIEW COMPARISON:  None. FINDINGS: Enlarged cardiac silhouette. Clear lungs with normal vasculature. Mild diffuse prominence of the interstitial markings. No pleural fluid. The bones appear osteopenic. IMPRESSION: Cardiomegaly and mild chronic interstitial  lung disease. Electronically Signed   By: Beckie Salts M.D.   On: 07/11/2018 14:55   US Abdomen Complete  Result Date: 07/11/2018 CLINICAL DATA:  Upper abdominal pain. EXAM: ABDOMEN ULTRASOUND COMPLETE COMPARISON:  None. FINDINGS: Gallbladder: No gallstones or wall thickening visualized. No sonographic Murphy sign noted by sonographer. Common bile duct: Diameter: 3.3 mm Liver: No focal lesion identified. Within normal limits in parenchymal echogenicity. Portal vein is patent on color Doppler imaging with normal direction of blood flow towards the liver. IVC: No abnormality visualized. Pancreas: Visualized portion unremarkable. Spleen: Size and appearance within normal limits. Right Kidney: Length: 11.5 x 4.5 x 5.6 cm. Echogenicity within normal limits. No mass or hydronephrosis visualized. Left Kidney: Length: 10.6 x 6.1 x 4.9 cm. Echogenicity within normal limits. No mass or hydronephrosis visualized. Abdominal aorta: No aneurysm visualized. Other findings: None. IMPRESSION: No cause for pain identified. Electronically Signed   By: Gerome Sam III M.D   On: 07/11/2018 20:23   [place] Ekg Site Rite  Result Date: 07/12/2018 If Site Rite image not attached, placement could not be confirmed due to current cardiac rhythm.  Scheduled Meds: . digoxin  0.125 mg Oral Daily  . furosemide  80 mg Intravenous BID  . heparin  4,000 Units Intravenous Once  . losartan  12.5 mg Oral Daily  . sodium chloride flush  3 mL Intravenous Once  . sodium chloride flush  3 mL Intravenous Q12H  . spironolactone  12.5 mg Oral QHS   Continuous Infusions: . sodium chloride    . heparin    . milrinone 0.375 mcg/kg/min (07/13/18 1129)     LOS: 2 days    Time spent: 35 mins.More than 50% of that time was spent in counseling and/or coordination of care.      Burnadette Pop, MD [facility] Pager [phone]  If 7PM-7AM, please contact night-coverage [email or web address] Password [email or web address] 07/13/2018, 12:00 PM

## 2018-07-13 NOTE — Progress Notes (Signed)
ANTICOAGULATION CONSULT NOTE - Initial Consult  Pharmacy Consult for heparin Indication: LV Thrombus  No Known Allergies  Patient Measurements: Height: 5\' 8"  (172.7 cm) Weight: 154 lb 4.8 oz (70 kg) IBW/kg (Calculated) : 68.4 Heparin Dosing Weight: 70kg  Vital Signs: Temp: 97.6 F (36.4 C) (02/05 0940) Temp Source: Oral (02/05 0940) BP: 108/94 (02/05 0940) Pulse Rate: 114 (02/05 0935)  Labs: Recent Labs    07/11/18 1411 07/12/18 0455 07/13/18 0422  HGB 13.8  --  14.3  HCT 44.3  --  44.8  PLT 327  --  359  CREATININE 1.28* 1.40* 1.22    Estimated Creatinine Clearance: 72.4 mL/min (by C-G formula based on SCr of 1.22 mg/dL).   Medical History: Past Medical History:  Diagnosis Date  . CHF (congestive heart failure) (HCC)   . Dental infection   . Enlarged heart   . Hernia, inguinal, right   . Hypertension      Assessment: 57 yoM with advanced HF found to have LV thrombus on ECHO. Pharmacy to begin heparin infusion. No OAC PTA, pt received enoxaparin for DVT prophylaxis 2/4 at 1730. CBC wnl this am.  Goal of Therapy:  Heparin level 0.3-0.7 units/ml Monitor platelets by anticoagulation protocol: Yes   Plan:  -Heparin 4000 units x1 then 1200 units/hr -Check 6hr heparin level -Daily heparin level and CBC -Will follow-up longterm oral anticoagulation plans  Fredonia Highland, PharmD, BCPS Clinical Pharmacist [phone] Please check AMION for all [facility] Pharmacy numbers 07/13/2018

## 2018-07-13 NOTE — Progress Notes (Addendum)
Advanced Heart Failure Rounding Note  PCP-Cardiologist: No primary care provider on file.   Subjective:    PICC line placed and milrinone 0.25 mcg/kg/min started yesterday. Initial coox 58% (had already started milrinone). Coox 52% today. SBP 100-110s. He was also started on digoxin and spiro.   Great diuresis with 80 mg IV lasix BID. Negative 3.6 L. Weight down 2 lbs. Creatinine improving 1.22. CVP 15  He still feels poorly. +cough with yellow sputum. Some dizziness. No CP. SOB with talking. +PND.   Echo currently being done at bedside.   Objective:   Weight Range: 70 kg Body mass index is 23.46 kg/m.   Vital Signs:   Temp:  [97.6 F (36.4 C)-98.1 F (36.7 C)] 98.1 F (36.7 C) (02/05 0538) Pulse Rate:  [100-113] 113 (02/05 0538) Resp:  [21-24] 21 (02/04 1939) BP: (106-124)/(83-102) 121/102 (02/05 0538) SpO2:  [95 %-99 %] 98 % (02/05 0538) Weight:  [70 kg] 70 kg (02/05 0538) Last BM Date: 07/12/18  Weight change: Filed Weights   07/11/18 1851 07/13/18 0538  Weight: 70.8 kg 70 kg    Intake/Output:   Intake/Output Summary (Last 24 hours) at 07/13/2018 0849 Last data filed at 07/13/2018 0655 Gross per 24 hour  Intake 1181.01 ml  Output 5050 ml  Net -3868.99 ml      Physical Exam    General:  SOB with talking. Appears fatigued.  HEENT: Normal Neck: Supple. JVP to jaw. Carotids 2+ bilat; no bruits. No lymphadenopathy or thyromegaly appreciated. Cor: PMI nondisplaced. Tachy, regular. +s3  Lungs: crackles in bases. Abdomen: Soft, RUQ tender, nondistended. +hepatomegaly. No bruits or masses. Good bowel sounds. Extremities: No cyanosis, clubbing, rash, edema Neuro: Alert & orientedx3, cranial nerves grossly intact. moves all 4 extremities w/o difficulty. Affect pleasant   Telemetry   Sinus tach 100s with LBBB. Personally reviewed.   EKG    No new tracings.   Labs    CBC Recent Labs    07/11/18 1411 07/13/18 0422  WBC 12.9* 12.2*  HGB 13.8 14.3    HCT 44.3 44.8  MCV 89.0 88.0  PLT 327 359   Basic Metabolic Panel Recent Labs    37/48/27 0455 07/13/18 0422  NA 140 137  K 3.1* 3.7  CL 100 98  CO2 28 28  GLUCOSE 103* 109*  BUN 16 17  CREATININE 1.40* 1.22  CALCIUM 8.7* 8.9  MG 1.8 2.2   Liver Function Tests Recent Labs    07/11/18 1724  AST 52*  ALT 63*  ALKPHOS 107  BILITOT 0.6  PROT 6.8  ALBUMIN 3.2*   Recent Labs    07/11/18 1724  LIPASE 32   Cardiac Enzymes No results for input(s): CKTOTAL, CKMB, CKMBINDEX, TROPONINI in the last 72 hours.  BNP: BNP (last 3 results) Recent Labs    07/11/18 1411  BNP 1,099.3*    ProBNP (last 3 results) No results for input(s): PROBNP in the last 8760 hours.   D-Dimer No results for input(s): DDIMER in the last 72 hours. Hemoglobin A1C No results for input(s): HGBA1C in the last 72 hours. Fasting Lipid Panel No results for input(s): CHOL, HDL, LDLCALC, TRIG, CHOLHDL, LDLDIRECT in the last 72 hours. Thyroid Function Tests No results for input(s): TSH, T4TOTAL, T3FREE, THYROIDAB in the last 72 hours.  Invalid input(s): FREET3  Other results:   Imaging    [place] Ekg Site Rite  Result Date: 07/12/2018 If [facility] image not attached, placement could not be confirmed due to  current cardiac rhythm.     Medications:     Scheduled Medications: . digoxin  0.125 mg Oral Daily  . enoxaparin (LOVENOX) injection  40 mg Subcutaneous Q24H  . furosemide  80 mg Intravenous BID  . sodium chloride flush  3 mL Intravenous Once  . sodium chloride flush  3 mL Intravenous Q12H  . spironolactone  12.5 mg Oral QHS     Infusions: . sodium chloride    . milrinone 0.25 mcg/kg/min (07/13/18 0543)     PRN Medications:  sodium chloride, albuterol, clonazePAM, guaiFENesin-dextromethorphan, ondansetron **OR** ondansetron (ZOFRAN) IV, sodium chloride flush, sodium chloride flush    Patient Profile   Paul Mccall is a 48 y.o. male with a history of chronic systolic  heart failure (EF30%) due to NICM, HTN, bipolar disorder, cocaine abuse, tobacco abuse, and medication noncompliance.   Admitted with SOB, CP, and abdominal pain.   Assessment/Plan   1. Acute on chronic systolic HF due to NICM from cocaine abuse. No ICD.  - LHC 2018 no significant coronary disease - Echo 09/2017: EF 30-35% - Recent RHC (06/09/18) with CO 4.1, CI 2.24 - Volume status remains elevated with low output. CVP 15. - Increase milrinone 0.375 mcg/kg/min. Coox 52% - Continue lasix 80 mg IV BID - Continue spiro 12.5 mg daily - Continue digoxin 0.125 mg daily - Add losartan 12.5 mg daily. Hopefully will be able to transition to [facility], but would need patient assistance to afford.   - No BB with low output - Repeat echo completed this morning. Read pending. - He is not currently a home inotrope or advanced therapies candidate.  - Would eventually qualify for CRT-D if he is able to stop cocaine and be compliant with medications/follow up.   2. CP - LHC 2018 with no significant CAD - Troponin 0.03. - EKG with LBBB. New as of 06/09/18. - He says he has had CP 1x/week for several years. Seems to be associated with stress. No associated symptoms. No CP currently.   3. LBBB - New as of 06/2018. Has not had new ischemic work up. Troponin 0.03. Thought to be secondary to worsening cardiomyopathy when evaluated at [facility]. No change.   4. Abdominal pain - Likely to low output. Abdominal US negative. Has liver congestion on exam.   5. Cocaine abuse - Last use 2/1. Discussed abstinence. No change.   6. Tobacco use - Smokes 1-2 cigarettes/week currently. Encouraged cessation  7. Bipolar disorder - He is not currently taking any medications. Says he stopped when he started using cocaine again - He needs to be set up with PCP. No change.   8. NSVT - K 3.7. Supp. Mag 2.2   Medication concerns reviewed with patient and pharmacy team. Barriers identified: compliance, drug abuse,  no PCP, transportation issues, unsure where he will be living longterm.   Addendum: LV thrombus noted on echo. Start heparin drip.   Length of Stay: 2  Alford Highland, NP  07/13/2018, 8:49 AM  Advanced Heart Failure Team Pager [phone] (M-F; 7a - 4p)  Please contact [facility] for night-coverage after hours (4p -7a ) and weekends on [email or web address]  Patient seen and examined with the above-signed Advanced Practice Provider and/or Housestaff. I personally reviewed laboratory data, imaging studies and relevant notes. I independently examined the patient and formulated the important aspects of the plan. I have edited the note to reflect any of my changes or salient points. I have personally discussed the plan with the patient and/or  family.  He remains quite ill despite milrinone gtt. Co-ox low 50s. Decent diuresis but still overloaded. Echo reviewed. EF 15% severe RV dysfunction with mobile LV clot. Options quite limited currently. Will increase milrinone to 0.375 and continue IV diuresis. Continue digoxin, losartan and spiro. Start heparin for LV clot.   Arvilla Meres, MD  10:51 PM

## 2018-07-14 LAB — COOXEMETRY PANEL
Carboxyhemoglobin: 1.4 % (ref 0.5–1.5)
Methemoglobin: 0.9 % (ref 0.0–1.5)
O2 Saturation: 61.5 %
Total hemoglobin: 16.3 g/dL — ABNORMAL HIGH (ref 12.0–16.0)

## 2018-07-14 LAB — MAGNESIUM: Magnesium: 2 mg/dL (ref 1.7–2.4)

## 2018-07-14 LAB — BASIC METABOLIC PANEL
Anion gap: 14 (ref 5–15)
BUN: 14 mg/dL (ref 6–20)
CALCIUM: 9 mg/dL (ref 8.9–10.3)
CO2: 26 mmol/L (ref 22–32)
Chloride: 96 mmol/L — ABNORMAL LOW (ref 98–111)
Creatinine, Ser: 1.14 mg/dL (ref 0.61–1.24)
GFR calc Af Amer: >60 mL/min (ref >60)
Glucose, Bld: 103 mg/dL — ABNORMAL HIGH (ref 70–99)
Potassium: 3.7 mmol/L (ref 3.5–5.1)
SODIUM: 136 mmol/L (ref 135–145)

## 2018-07-14 LAB — CBC
HCT: 47 % (ref 39.0–52.0)
Hemoglobin: 15.8 g/dL (ref 13.0–17.0)
MCH: 28.7 pg (ref 26.0–34.0)
MCHC: 33.6 g/dL (ref 30.0–36.0)
MCV: 85.5 fL (ref 80.0–100.0)
Platelets: 378 10*3/uL (ref 150–400)
RBC: 5.5 MIL/uL (ref 4.22–5.81)
RDW: 13.6 % (ref 11.5–15.5)
WBC: 11.3 10*3/uL — ABNORMAL HIGH (ref 4.0–10.5)
nRBC: 0 % (ref 0.0–0.2)

## 2018-07-14 LAB — HEPARIN LEVEL (UNFRACTIONATED)
Heparin Unfractionated: 0.18 IU/mL — ABNORMAL LOW (ref 0.30–0.70)
Heparin Unfractionated: 0.58 IU/mL (ref 0.30–0.70)

## 2018-07-14 MED ORDER — HEPARIN BOLUS VIA INFUSION
2000.0000 [IU] | Freq: Once | INTRAVENOUS | Status: AC
  Administered 2018-07-14: 2000 [IU] via INTRAVENOUS
  Filled 2018-07-14: qty 2000

## 2018-07-14 MED ORDER — QUETIAPINE FUMARATE 50 MG PO TABS
50.0000 mg | ORAL_TABLET | Freq: Every day | ORAL | Status: DC
  Administered 2018-07-14 – 2018-07-18 (×5): 50 mg via ORAL
  Filled 2018-07-14 (×5): qty 1

## 2018-07-14 MED ORDER — DIPHENHYDRAMINE HCL 25 MG PO CAPS
25.0000 mg | ORAL_CAPSULE | Freq: Once | ORAL | Status: AC
  Administered 2018-07-14: 25 mg via ORAL
  Filled 2018-07-14: qty 1

## 2018-07-14 MED ORDER — POTASSIUM CHLORIDE CRYS ER 20 MEQ PO TBCR
40.0000 meq | EXTENDED_RELEASE_TABLET | Freq: Once | ORAL | Status: AC
  Administered 2018-07-14: 40 meq via ORAL
  Filled 2018-07-14: qty 2

## 2018-07-14 MED ORDER — SPIRONOLACTONE 25 MG PO TABS
25.0000 mg | ORAL_TABLET | Freq: Every day | ORAL | Status: DC
  Administered 2018-07-14 – 2018-07-18 (×5): 25 mg via ORAL
  Filled 2018-07-14 (×5): qty 1

## 2018-07-14 NOTE — Progress Notes (Addendum)
Advanced Heart Failure Rounding Note  PCP-Cardiologist: No primary care provider on file.   Subjective:    Milrinone increased to 0.375 mcg/kg/min. Coox 62%. SBP 100-110s  Started on heparin for LV thrombus noted on echo.   Good diuresis with 80 mg IV lasix BID. Down 3 lbs overnight. Creatinine stable 1.14. CVP 8.  Feels better today. Appetite picking up. Only a few "twinges" of CP. He and his fiance are still unsure where they are going to live long term. They are worried about affording medications.   Echo 07/14/18: EF 15-20%, dilated CM, anteroseptal, inferoseptal, and inferior akinesis. Otherwise severe global hypokinesis, grade 3 DD, 2.6 cm mobile thrombus at apical lateral myocardium, severely dilated LA, mildly dilated RA, moderate MR, mild calcification of aortic valve  Objective:   Weight Range: 68.8 kg Body mass index is 23.07 kg/m.   Vital Signs:   Temp:  [97.3 F (36.3 C)-98.4 F (36.9 C)] 97.3 F (36.3 C) (02/06 0826) Pulse Rate:  [94-104] 94 (02/06 0826) Resp:  [22] 22 (02/05 1542) BP: (104-114)/(80-86) 104/85 (02/06 0826) SpO2:  [94 %-100 %] 98 % (02/06 0826) Weight:  [68.8 kg] 68.8 kg (02/06 0500) Last BM Date: 07/12/18  Weight change: Filed Weights   07/11/18 1851 07/13/18 0538 07/14/18 0500  Weight: 70.8 kg 70 kg 68.8 kg    Intake/Output:   Intake/Output Summary (Last 24 hours) at 07/14/2018 1000 Last data filed at 07/14/2018 0900 Gross per 24 hour  Intake 1020.5 ml  Output 4125 ml  Net -3104.5 ml      Physical Exam    General: Appears fatigued HEENT: Normal Neck: Supple. JVP 7-8. Carotids 2+ bilat; no bruits. No thyromegaly or nodule noted. Cor: PMI nondisplaced. Tachy, regular, +s3 Lungs: CTAB, normal effort. Abdomen: Soft, RUQ still tender, non-distended, no HSM. No bruits or masses. +BS  Extremities: No cyanosis, clubbing, or rash. R and LLE no edema.  Neuro: Alert & orientedx3, cranial nerves grossly intact. moves all 4 extremities  w/o difficulty. Affect pleasant  Telemetry   NSR/Sinus tach 100 with LBBB. Personally reviewed.   EKG    No new tracings.   Labs    CBC Recent Labs    07/13/18 0422 07/14/18 0522  WBC 12.2* 11.3*  HGB 14.3 15.8  HCT 44.8 47.0  MCV 88.0 85.5  PLT 359 378   Basic Metabolic Panel Recent Labs    08/81/10 0422 07/14/18 0522  NA 137 136  K 3.7 3.7  CL 98 96*  CO2 28 26  GLUCOSE 109* 103*  BUN 17 14  CREATININE 1.22 1.14  CALCIUM 8.9 9.0  MG 2.2 2.0   Liver Function Tests Recent Labs    07/11/18 1724  AST 52*  ALT 63*  ALKPHOS 107  BILITOT 0.6  PROT 6.8  ALBUMIN 3.2*   Recent Labs    07/11/18 1724  LIPASE 32   Cardiac Enzymes No results for input(s): CKTOTAL, CKMB, CKMBINDEX, TROPONINI in the last 72 hours.  BNP: BNP (last 3 results) Recent Labs    07/11/18 1411  BNP 1,099.3*    ProBNP (last 3 results) No results for input(s): PROBNP in the last 8760 hours.   D-Dimer No results for input(s): DDIMER in the last 72 hours. Hemoglobin A1C No results for input(s): HGBA1C in the last 72 hours. Fasting Lipid Panel No results for input(s): CHOL, HDL, LDLCALC, TRIG, CHOLHDL, LDLDIRECT in the last 72 hours. Thyroid Function Tests No results for input(s): TSH, T4TOTAL, T3FREE, THYROIDAB in  the last 72 hours.  Invalid input(s): FREET3  Other results:   Imaging    No results found.   Medications:     Scheduled Medications: . digoxin  0.125 mg Oral Daily  . furosemide  80 mg Intravenous BID  . losartan  12.5 mg Oral BID  . sodium chloride flush  3 mL Intravenous Once  . sodium chloride flush  3 mL Intravenous Q12H  . spironolactone  12.5 mg Oral QHS    Infusions: . sodium chloride    . heparin 1,400 Units/hr (07/14/18 0506)  . milrinone 0.375 mcg/kg/min (07/14/18 0734)    PRN Medications: sodium chloride, albuterol, clonazePAM, guaiFENesin-dextromethorphan, ondansetron **OR** ondansetron (ZOFRAN) IV, sodium chloride flush, sodium  chloride flush    Patient Profile   Paul Mccall is a 48 y.o. male with a history of chronic systolic heart failure (EF30%) due to NICM, HTN, bipolar disorder, cocaine abuse, tobacco abuse, and medication noncompliance.   Admitted with SOB, CP, and abdominal pain.   Assessment/Plan   1. Acute on chronic systolic HF due to NICM from cocaine abuse. No ICD.  - LHC 2018 no significant coronary disease - Echo 09/2017: EF 30-35%. Echo 2/20: EF 15-20%. - Recent RHC (06/09/18) with CO 4.1, CI 2.24 - Volume much improved. CVP ~8. - Continue milrinone 0.375 mcg/kg/min. Coox 62% - Continue lasix 80 mg IV BID for one more day. Likely to PO tomorrow. Creatinine stable 1.14 - Increase spiro to 25 mg daily - Continue digoxin 0.125 mg daily - Continue losartan 12.5 mg daily. SBP 100s. Hold off on increasing.   - No BB with low output - Echo 07/14/18: EF 15-20%, dilated CM, anteroseptal, inferoseptal, and inferior akinesis. Otherwise severe global hypokinesis, grade 3 DD, 2.6 cm mobile thrombus at apical lateral myocardium, severely dilated LA, mildly dilated RA, moderate MR, mild calcification of aortic valve - He is not currently a home inotrope or advanced therapies candidate.  - Would eventually qualify for CRT-D if he is able to stop cocaine and be compliant with medications/follow up.   2. Chest Pain - LHC 2018 with no significant CAD - Troponin 0.03. - EKG with LBBB. New as of 06/09/18. - He says he has had CP 1x/week for several years. Seems to be associated with stress. No associated symptoms. No further chest pain.   3. LV thrombus - Continue heparin drip for now.  - Pt is concerned about affording AC at discharge  4. LBBB - New as of 06/2018. Has not had new ischemic work up. Troponin 0.03. Thought to be secondary to worsening cardiomyopathy when evaluated at [facility]. No change.   5. Abdominal pain - Likely to low output. Abdominal US negative. Has liver congestion on exam. No change.    6. Cocaine abuse - Last use 2/1. Discussed abstinence. No change.    7. Tobacco use - Smokes 1-2 cigarettes/week currently. Encouraged cessation. No change.   8. Bipolar disorder - He is not currently taking any medications. Says he stopped when he started using cocaine again - He needs to be set up with PCP. No change.   9. NSVT - K 3.7. Supp. Mag 2.0  Medication concerns reviewed with patient and pharmacy team. Barriers identified: compliance, drug abuse, no PCP, transportation issues, unsure where he will be living longterm.    Length of Stay: 3  Alford Highland, NP  07/14/2018, 10:00 AM  Advanced Heart Failure Team Pager [phone] (M-F; 7a - 4p)  Please contact [facility] for night-coverage  after hours (4p -7a ) and weekends on [email or web address]  Patient seen and examined with the above-signed Advanced Practice Provider and/or Housestaff. I personally reviewed laboratory data, imaging studies and relevant notes. I independently examined the patient and formulated the important aspects of the plan. I have edited the note to reflect any of my changes or salient points. I have personally discussed the plan with the patient and/or family.  He remains on milrinone. Still very tenuous. CVP improving with diuresis. Co-ox improved. Remains quite weak in setting of severe biventricular failure. Will continue IV diuresis one more day. Titrate losartan and spiro. No b-blocker yet. Will attempt to wean milrinone soon. Continue heparin for LV clot. Will need coumadin but managing INR will be difficult given his living situation.  SW is involved.   Arvilla Meres, MD  4:47 PM

## 2018-07-14 NOTE — Progress Notes (Signed)
ANTICOAGULATION CONSULT NOTE - Follow-Up Consult  Pharmacy Consult for heparin Indication: LV Thrombus  No Known Allergies  Patient Measurements: Height: 5\' 8"  (172.7 cm) Weight: 151 lb 11.2 oz (68.8 kg) IBW/kg (Calculated) : 68.4 Heparin Dosing Weight: 70kg  Vital Signs: Temp: 98 F (36.7 C) (02/06 1149) Temp Source: Oral (02/06 1149) BP: 101/79 (02/06 1654) Pulse Rate: 95 (02/06 1654)  Labs: Recent Labs    07/12/18 0455 07/13/18 0422 07/13/18 1830 07/14/18 0522 07/14/18 0629 07/14/18 1825  HGB  --  14.3  --  15.8  --   --   HCT  --  44.8  --  47.0  --   --   PLT  --  359  --  378  --   --   HEPARINUNFRC  --   --  <0.10*  --  0.18* 0.58  CREATININE 1.40* 1.22  --  1.14  --   --     Estimated Creatinine Clearance: 77.5 mL/min (by C-G formula based on SCr of 1.14 mg/dL).   Medical History: Past Medical History:  Diagnosis Date  . CHF (congestive heart failure) (HCC)   . Dental infection   . Enlarged heart   . Hernia, inguinal, right   . Hypertension      Assessment: 30 yoM with advanced HF found to have LV thrombus on ECHO. Pharmacy to begin heparin infusion. No OAC PTA, heparin now therapeutic after rate adjustment. CBC stable, no S/Sx bleeding noted or issues with infusion per RN.  Goal of Therapy:  Heparin level 0.3-0.7 units/ml Monitor platelets by anticoagulation protocol: Yes   Plan:  -Heparin 1650 units/h -Daily heparin level and CBC -Will follow-up longterm oral anticoagulation plans  Isaac Bliss, PharmD, BCPS, BCCCP Clinical Pharmacist [phone]  Please check AMION for all [facility] Pharmacy numbers  07/14/2018 7:06 PM

## 2018-07-14 NOTE — Progress Notes (Signed)
ANTICOAGULATION CONSULT NOTE - Follow-Up Consult  Pharmacy Consult for heparin Indication: LV Thrombus  No Known Allergies  Patient Measurements: Height: 5\' 8"  (172.7 cm) Weight: 151 lb 11.2 oz (68.8 kg) IBW/kg (Calculated) : 68.4 Heparin Dosing Weight: 70kg  Vital Signs: Temp: 97.3 F (36.3 C) (02/06 0826) Temp Source: Oral (02/06 0826) BP: 104/85 (02/06 0826) Pulse Rate: 94 (02/06 0826)  Labs: Recent Labs    07/11/18 1411 07/12/18 0455 07/13/18 0422 07/13/18 1830 07/14/18 0522 07/14/18 0629  HGB 13.8  --  14.3  --  15.8  --   HCT 44.3  --  44.8  --  47.0  --   PLT 327  --  359  --  378  --   HEPARINUNFRC  --   --   --  <0.10*  --  0.18*  CREATININE 1.28* 1.40* 1.22  --  1.14  --     Estimated Creatinine Clearance: 77.5 mL/min (by C-G formula based on SCr of 1.14 mg/dL).   Medical History: Past Medical History:  Diagnosis Date  . CHF (congestive heart failure) (HCC)   . Dental infection   . Enlarged heart   . Hernia, inguinal, right   . Hypertension      Assessment: 38 yoM with advanced HF found to have LV thrombus on ECHO. Pharmacy to begin heparin infusion. No OAC PTA, heparin remains subtherapeutic after rate adjustment. CBC stable, no S/Sx bleeding noted or issues with infusion per RN.  Goal of Therapy:  Heparin level 0.3-0.7 units/ml Monitor platelets by anticoagulation protocol: Yes   Plan:  -Heparin 2000 units x1 then increase to 1650 units/h -Check 6hr heparin level -Daily heparin level and CBC -Will follow-up longterm oral anticoagulation plans  Fredonia Highland, PharmD, BCPS Clinical Pharmacist [phone] Please check AMION for all [facility] Pharmacy numbers 07/14/2018

## 2018-07-14 NOTE — Progress Notes (Signed)
PROGRESS NOTE    Paul Mccall  ZPH:[id number] DOB: [birth year] DOA: 07/11/2018 PCP: Paul Mccall, No Pcp Per   Brief Narrative: Paul Mccall is a 48 year old male with history of severe nonischemic cardiomyopathy with ejection fraction of 20%, cocaine abuse , noncompliance who presents to the emergency department with complaints of chest pain or shortness of breath.  Paul Mccall had a cardiac cath on 06/09/2018 with pain which showed an ejection fraction of less than 20%.  He just moved to [place].  He was also complaining of right upper quadrant pain.  Found to have elevated BNP on presentation.  Chest ray showed cardiomegaly.  Cardiology consulted for severe congestive heart failure.   Assessment & Plan:   Active Problems:   Congestive heart failure (CHF) (HCC)   Acute on congestive heart failure: History of systolic heart failure thought to be secondary to nonischemic cardiomyopathy.  Ejection fraction of 20%  as per  cardiac cath on 2/20 at [facility].  Cardiology consulted and following.  Continued on IV Lasix.  Continue input/output monitoring.  Elevated  BNP on presentation. Also started on milrinone, spironolactone and digoxin.  Echocardiogram done here showed ejection fraction of 15 to 20%.,  Dilated cardiomyopathy, grade 3 diastolic dysfunction.  Severe hypokinesis Having good diuresis. Plan is for CRT if he gives up drug abuse and noncompliance.  Apical thrombus: 2.6cm mobile thrombus noted at the apical lateral myocardium asper Echo.  Started on heparin drip. Oral anticoagulation as per cardiology.  Hypertension: Currently blood pressure stable.  Chest pain/Right upper quadrant pain: Chest pain has resolved.  Abdomen ultrasound did not show any cholelithiasis or any signs of cholecystitis.  Still complains of right upper quadrant pain and there is RUQ tenderness.  This is most likely tender hepatomegaly from congestive heart failure.  CKD stage II: Currently stable.  Monitor with  diuresis.  Hypokalemia: Supplemented with potassium.  Cocaine abuse/smoker: Snorts/smokes cocaine.  Counseled for cessation.  Bipolar disorder/anxiety disorder: Continue Klonopin for now.  He needs to follow-up with psychiatry as an outpatient after discharge.         DVT prophylaxis: Lovenox Code Status: Full Family Communication: Wife present at bedside Disposition Plan: Home after stabilization of CHF status   Consultants: Cardiology  Procedures: None  Antimicrobials:  Anti-infectives (From admission, onward)   None      Subjective: Paul Mccall seen and examined the bedside this morning.  Still complains of shortness of breath today.  But feels much better than previous days.  Still complains of right upper quadrant pain.  Objective: Vitals:   07/14/18 0447 07/14/18 0500 07/14/18 0826 07/14/18 1149  BP: 110/84  104/85   Pulse: 97  94 (!) 101  Resp:      Temp: 97.8 F (36.6 C)  (!) 97.3 F (36.3 C) 98 F (36.7 C)  TempSrc: Oral  Oral Oral  SpO2: 100%  98% 97%  Weight:  68.8 kg    Height:        Intake/Output Summary (Last 24 hours) at 07/14/2018 1225 Last data filed at 07/14/2018 1012 Gross per 24 hour  Intake 945.81 ml  Output 3950 ml  Net -3004.19 ml   Filed Weights   07/11/18 1851 07/13/18 0538 07/14/18 0500  Weight: 70.8 kg 70 kg 68.8 kg    Examination:  General exam: Weak, lying in the bed  HEENT:PERRL,Oral mucosa moist, Ear/Nose normal on gross exam Respiratory system: Bilateral decreased air entry on the bases Cardiovascular system: S1 & S2 heard, RRR.  Elevated JVD, murmurs, rubs, gallops  or clicks. Gastrointestinal system: Abdomen is nondistended, soft.  Right upper quadrant tenderness Central nervous system: Alert and oriented. No focal neurological deficits. Extremities: No edema, no clubbing ,no cyanosis, distal peripheral pulses palpable. Skin: No rashes, lesions or ulcers,no icterus ,no pallor    Data Reviewed: I have personally  reviewed following labs and imaging studies  CBC: Recent Labs  Lab 07/11/18 1411 07/13/18 0422 07/14/18 0522  WBC 12.9* 12.2* 11.3*  HGB 13.8 14.3 15.8  HCT 44.3 44.8 47.0  MCV 89.0 88.0 85.5  PLT 327 359 378   Basic Metabolic Panel: Recent Labs  Lab 07/11/18 1411 07/12/18 0455 07/13/18 0422 07/14/18 0522  NA 140 140 137 136  K 3.1* 3.1* 3.7 3.7  CL 103 100 98 96*  CO2 23 28 28 26   GLUCOSE 147* 103* 109* 103*  BUN 15 16 17 14   CREATININE 1.28* 1.40* 1.22 1.14  CALCIUM 9.0 8.7* 8.9 9.0  MG  --  1.8 2.2 2.0   GFR: Estimated Creatinine Clearance: 77.5 mL/min (by C-G formula based on SCr of 1.14 mg/dL). Liver Function Tests: Recent Labs  Lab 07/11/18 1724  AST 52*  ALT 63*  ALKPHOS 107  BILITOT 0.6  PROT 6.8  ALBUMIN 3.2*   Recent Labs  Lab 07/11/18 1724  LIPASE 32   No results for input(s): AMMONIA in the last 168 hours. Coagulation Profile: No results for input(s): INR, PROTIME in the last 168 hours. Cardiac Enzymes: No results for input(s): CKTOTAL, CKMB, CKMBINDEX, TROPONINI in the last 168 hours. BNP (last 3 results) No results for input(s): PROBNP in the last 8760 hours. HbA1C: No results for input(s): HGBA1C in the last 72 hours. CBG: No results for input(s): GLUCAP in the last 168 hours. Lipid Profile: No results for input(s): CHOL, HDL, LDLCALC, TRIG, CHOLHDL, LDLDIRECT in the last 72 hours. Thyroid Function Tests: No results for input(s): TSH, T4TOTAL, FREET4, T3FREE, THYROIDAB in the last 72 hours. Anemia Panel: No results for input(s): VITAMINB12, FOLATE, FERRITIN, TIBC, IRON, RETICCTPCT in the last 72 hours. Sepsis Labs: No results for input(s): PROCALCITON, LATICACIDVEN in the last 168 hours.  No results found for this or any previous visit (from the past 240 hour(s)).       Radiology Studies: No results found.      Scheduled Meds: . digoxin  0.125 mg Oral Daily  . furosemide  80 mg Intravenous BID  . losartan  12.5 mg  Oral BID  . sodium chloride flush  3 mL Intravenous Once  . sodium chloride flush  3 mL Intravenous Q12H  . spironolactone  25 mg Oral QHS   Continuous Infusions: . sodium chloride    . heparin 1,650 Units/hr (07/14/18 1010)  . milrinone 0.375 mcg/kg/min (07/14/18 0734)     LOS: 3 days    Time spent: 35 mins.More than 50% of that time was spent in counseling and/or coordination of care.      Burnadette Pop, MD [facility] Pager [phone]  If 7PM-7AM, please contact night-coverage [email or web address] Password [email or web address] 07/14/2018, 12:25 PM

## 2018-07-15 ENCOUNTER — Encounter (HOSPITAL_COMMUNITY): Payer: Self-pay | Admitting: Surgery

## 2018-07-15 LAB — MAGNESIUM: Magnesium: 2.2 mg/dL (ref 1.7–2.4)

## 2018-07-15 LAB — CBC
HCT: 50.7 % (ref 39.0–52.0)
Hemoglobin: 16.2 g/dL (ref 13.0–17.0)
MCH: 27.4 pg (ref 26.0–34.0)
MCHC: 32 g/dL (ref 30.0–36.0)
MCV: 85.8 fL (ref 80.0–100.0)
Platelets: 396 10*3/uL (ref 150–400)
RBC: 5.91 MIL/uL — ABNORMAL HIGH (ref 4.22–5.81)
RDW: 13.7 % (ref 11.5–15.5)
WBC: 9.8 10*3/uL (ref 4.0–10.5)
nRBC: 0 % (ref 0.0–0.2)

## 2018-07-15 LAB — BASIC METABOLIC PANEL
ANION GAP: 10 (ref 5–15)
BUN: 14 mg/dL (ref 6–20)
CO2: 26 mmol/L (ref 22–32)
Calcium: 8.9 mg/dL (ref 8.9–10.3)
Chloride: 98 mmol/L (ref 98–111)
Creatinine, Ser: 1.27 mg/dL — ABNORMAL HIGH (ref 0.61–1.24)
GFR calc Af Amer: >60 mL/min (ref >60)
GFR calc non Af Amer: >60 mL/min (ref >60)
Glucose, Bld: 143 mg/dL — ABNORMAL HIGH (ref 70–99)
Potassium: 4.1 mmol/L (ref 3.5–5.1)
Sodium: 134 mmol/L — ABNORMAL LOW (ref 135–145)

## 2018-07-15 LAB — HEPARIN LEVEL (UNFRACTIONATED)
Heparin Unfractionated: 2.2 IU/mL — ABNORMAL HIGH (ref 0.30–0.70)
Heparin Unfractionated: 0.76 IU/mL — ABNORMAL HIGH (ref 0.30–0.70)

## 2018-07-15 LAB — COOXEMETRY PANEL
CARBOXYHEMOGLOBIN: 1.2 % (ref 0.5–1.5)
METHEMOGLOBIN: 1.5 % (ref 0.0–1.5)
O2 Saturation: 65.6 %
Total hemoglobin: 17 g/dL — ABNORMAL HIGH (ref 12.0–16.0)

## 2018-07-15 MED ORDER — WARFARIN - PHARMACIST DOSING INPATIENT
Freq: Every day | Status: DC
  Administered 2018-07-15: 17:00:00

## 2018-07-15 MED ORDER — WARFARIN SODIUM 7.5 MG PO TABS
7.5000 mg | ORAL_TABLET | Freq: Once | ORAL | Status: AC
  Administered 2018-07-15: 7.5 mg via ORAL
  Filled 2018-07-15: qty 1

## 2018-07-15 MED ORDER — MILRINONE LACTATE IN DEXTROSE 20-5 MG/100ML-% IV SOLN
0.1250 ug/kg/min | INTRAVENOUS | Status: DC
  Administered 2018-07-17: 0.125 ug/kg/min via INTRAVENOUS
  Filled 2018-07-15 (×2): qty 100

## 2018-07-15 MED FILL — SPIRONOLACTONE 25 MG TABLET: 25 | 34 days supply | Qty: 34 | Fill #0

## 2018-07-15 MED FILL — WARFARIN SODIUM 5 MG TABLET: 5 | 34 days supply | Qty: 34 | Fill #0

## 2018-07-15 MED FILL — DIGOXIN 0.125 MG TABLET: 125 | 34 days supply | Qty: 34 | Fill #0

## 2018-07-15 MED FILL — LOSARTAN POTASSIUM 25 MG TA: 25 | 34 days supply | Qty: 34 | Fill #0

## 2018-07-15 MED FILL — FUROSEMIDE 80 MG TABLET: 80 | 100 days supply | Qty: 100 | Fill #0

## 2018-07-15 NOTE — Progress Notes (Signed)
CARDIAC REHAB PHASE I   PRE:  Rate/Rhythm: 102 ST    BP: sitting 114/87    SaO2: 95 RA  MODE:  Ambulation: 430 ft   POST:  Rate/Rhythm: 107 ST    BP: sitting 103/80     SaO2: 93 RA  Pt able to walk but very slow. Sts his legs feel like lead, also his arm that pushed IV pole. VSS. Pt sleepy from new psych meds. To recliner. Discussed HF booklet with pt and girlfriend. They are hoping to get a scale. Watching sodium is difficult due to living in a motel but I gave them some options. They had [employer] today. Left materials.  [phone]   Harriet Masson CES, ACSM 07/15/2018 12:18 PM

## 2018-07-15 NOTE — Progress Notes (Signed)
ANTICOAGULATION CONSULT NOTE - Follow-Up Consult  Pharmacy Consult for heparin + warfarin Indication: LV Thrombus  No Known Allergies  Patient Measurements: Height: 5\' 8"  (172.7 cm) Weight: 151 lb 10.8 oz (68.8 kg) IBW/kg (Calculated) : 68.4 Heparin Dosing Weight: 70kg  Vital Signs: Temp: 98 F (36.7 C) (02/07 0843) Temp Source: Oral (02/07 0843) BP: 100/75 (02/07 0317) Pulse Rate: 97 (02/07 0847)  Labs: Recent Labs    07/13/18 0422  07/14/18 0522  07/14/18 1825 07/15/18 0409 07/15/18 0726  HGB 14.3  --  15.8  --   --  16.2  --   HCT 44.8  --  47.0  --   --  50.7  --   PLT 359  --  378  --   --  396  --   HEPARINUNFRC  --    < >  --    < > 0.58 2.20* 0.76*  CREATININE 1.22  --  1.14  --   --  1.27*  --    < > = values in this interval not displayed.    Estimated Creatinine Clearance: 69.6 mL/min (A) (by C-G formula based on SCr of 1.27 mg/dL (H)).   Medical History: Past Medical History:  Diagnosis Date  . CHF (congestive heart failure) (HCC)   . Dental infection   . Enlarged heart   . Hernia, inguinal, right   . Hypertension      Assessment: 12 yoM with advanced HF found to have LV thrombus on ECHO. Pharmacy to begin heparin infusion. No OAC PTA, heparin slightly above goal, no S/Sx bleeding per RN. Pt to begin warfarin load tonight - social work to follow-up with plans for INR monitoring.  Goal of Therapy:  Heparin level 0.3-0.7 units/ml Monitor platelets by anticoagulation protocol: Yes   Plan:  -Reduce heparin to 1550 units/hr -Recheck heparin level with daily labs -Warfarin 7.5mg  PO x1 tonight -Daily heparin level, CBC, INR   Fredonia Highland, PharmD, BCPS Clinical Pharmacist [phone] Please check AMION for all [facility] Pharmacy numbers 07/15/2018

## 2018-07-15 NOTE — Progress Notes (Signed)
PROGRESS NOTE    Paul Mccall  MEB:[id number] DOB: [birth year] DOA: 07/11/2018 PCP: Patient, No Pcp Per   Brief Narrative: Patient is a 48 year old male with history of severe nonischemic cardiomyopathy with ejection fraction of 20%, cocaine abuse , noncompliance who presents to the emergency department with complaints of chest pain or shortness of breath.  Patient had a cardiac cath on 06/09/2018 with pain which showed an ejection fraction of less than 20%.  He just moved to [employer].  He was also complaining of right upper quadrant pain.  Found to have elevated BNP on presentation.  Chest ray showed cardiomegaly.  Cardiology consulted for severe congestive heart failure.  07/15/2018: Patient seen.  No new complaints.  Cardiology (CHF team) managing.  No chest pain.  No shortness of breath.  Assessment & Plan:   Active Problems:   Congestive heart failure (CHF) (HCC)   Acute on congestive heart failure: History of systolic heart failure thought to be secondary to nonischemic cardiomyopathy.  Ejection fraction of 20%  as per  cardiac cath on 2/20 at [facility].  Cardiology consulted and following.  Continued on IV Lasix.  Continue input/output monitoring.  Elevated  BNP on presentation. Also started on milrinone, spironolactone and digoxin.  Echocardiogram done here showed ejection fraction of 15 to 20%.,  Dilated cardiomyopathy, grade 3 diastolic dysfunction.  Severe hypokinesis Having good diuresis. Plan is for CRT if he gives up drug abuse and noncompliance. 07/15/2018: Patient seen.  No new complaints.  Cardiology (CHF team) managing.  No chest pain.  No shortness of breath.  Apical thrombus: 2.6cm mobile thrombus noted at the apical lateral myocardium asper Echo.  Started on heparin drip. Oral anticoagulation as per cardiology.  Hypertension: Currently blood pressure stable.  Chest pain/Right upper quadrant pain: Chest pain has resolved.  Abdomen ultrasound did not show any cholelithiasis or  any signs of cholecystitis.  Still complains of right upper quadrant pain and there is RUQ tenderness.  This is most likely tender hepatomegaly from congestive heart failure. 07/15/2018: Resolved.  CKD stage II: Currently stable.  Monitor with diuresis.  Hypokalemia: Supplemented with potassium. 07/15/2018: Resolved.  Cocaine abuse/smoker:  Snorts/smokes cocaine.  Counseled for cessation.  Bipolar disorder/anxiety disorder:  Continue Klonopin for now.  He needs to follow-up with psychiatry as an outpatient after discharge. 07/15/2018: Stable.   DVT prophylaxis: Lovenox Code Status: Full Family Communication: Wife present at bedside Disposition Plan: Home after stabilization of CHF status   Consultants: Cardiology  Procedures: None  Antimicrobials:  Anti-infectives (From admission, onward)   None      Subjective: Shortness of breath is improving. No chest pain.  Objective: Vitals:   07/15/18 0317 07/15/18 0843 07/15/18 0847 07/15/18 1211  BP: 100/75   103/80  Pulse: (!) 105  97   Resp:  20  20  Temp: 97.9 F (36.6 C) 98 F (36.7 C)  97.7 F (36.5 C)  TempSrc: Oral Oral  Oral  SpO2: 97%     Weight: 68.8 kg     Height:        Intake/Output Summary (Last 24 hours) at 07/15/2018 1536 Last data filed at 07/15/2018 1212 Gross per 24 hour  Intake 1440.21 ml  Output 2275 ml  Net -834.79 ml   Filed Weights   07/13/18 0538 07/14/18 0500 07/15/18 0317  Weight: 70 kg 68.8 kg 68.8 kg    Examination:  General exam: Not in any obvious distress.  Awake and alert. HEENT:PERRL,Oral mucosa moist, Ear/Nose normal on gross exam  Respiratory system: Clear to auscultation. Cardiovascular system: S1 & S2 heard Gastrointestinal system: Abdomen is nondistended, soft.  Right upper quadrant tenderness Central nervous system: Alert and oriented. No focal neurological deficits. Extremities: No edema  Data Reviewed: I have personally reviewed following labs and imaging  studies  CBC: Recent Labs  Lab 07/11/18 1411 07/13/18 0422 07/14/18 0522 07/15/18 0409  WBC 12.9* 12.2* 11.3* 9.8  HGB 13.8 14.3 15.8 16.2  HCT 44.3 44.8 47.0 50.7  MCV 89.0 88.0 85.5 85.8  PLT 327 359 378 396   Basic Metabolic Panel: Recent Labs  Lab 07/11/18 1411 07/12/18 0455 07/13/18 0422 07/14/18 0522 07/15/18 0409  NA 140 140 137 136 134*  K 3.1* 3.1* 3.7 3.7 4.1  CL 103 100 98 96* 98  CO2 23 28 28 26 26   GLUCOSE 147* 103* 109* 103* 143*  BUN 15 16 17 14 14   CREATININE 1.28* 1.40* 1.22 1.14 1.27*  CALCIUM 9.0 8.7* 8.9 9.0 8.9  MG  --  1.8 2.2 2.0 2.2   GFR: Estimated Creatinine Clearance: 69.6 mL/min (A) (by C-G formula based on SCr of 1.27 mg/dL (H)). Liver Function Tests: Recent Labs  Lab 07/11/18 1724  AST 52*  ALT 63*  ALKPHOS 107  BILITOT 0.6  PROT 6.8  ALBUMIN 3.2*   Recent Labs  Lab 07/11/18 1724  LIPASE 32   No results for input(s): AMMONIA in the last 168 hours. Coagulation Profile: No results for input(s): INR, PROTIME in the last 168 hours. Cardiac Enzymes: No results for input(s): CKTOTAL, CKMB, CKMBINDEX, TROPONINI in the last 168 hours. BNP (last 3 results) No results for input(s): PROBNP in the last 8760 hours. HbA1C: No results for input(s): HGBA1C in the last 72 hours. CBG: No results for input(s): GLUCAP in the last 168 hours. Lipid Profile: No results for input(s): CHOL, HDL, LDLCALC, TRIG, CHOLHDL, LDLDIRECT in the last 72 hours. Thyroid Function Tests: No results for input(s): TSH, T4TOTAL, FREET4, T3FREE, THYROIDAB in the last 72 hours. Anemia Panel: No results for input(s): VITAMINB12, FOLATE, FERRITIN, TIBC, IRON, RETICCTPCT in the last 72 hours. Sepsis Labs: No results for input(s): PROCALCITON, LATICACIDVEN in the last 168 hours.  No results found for this or any previous visit (from the past 240 hour(s)).       Radiology Studies: No results found.      Scheduled Meds: . digoxin  0.125 mg Oral Daily   . losartan  12.5 mg Oral BID  . QUEtiapine  50 mg Oral QHS  . sodium chloride flush  3 mL Intravenous Once  . sodium chloride flush  3 mL Intravenous Q12H  . spironolactone  25 mg Oral QHS  . warfarin  7.5 mg Oral ONCE-1800  . Warfarin - Pharmacist Dosing Inpatient   Does not apply q1800   Continuous Infusions: . sodium chloride    . heparin 1,550 Units/hr (07/15/18 1332)  . milrinone 0.25 mcg/kg/min (07/15/18 0955)     LOS: 4 days    Time spent: 25 mins.      Barnetta Chapel, MD [facility] Pager [phone]  If 7PM-7AM, please contact night-coverage [email or web address] Password [facility] 07/15/2018, 3:36 PM

## 2018-07-15 NOTE — Progress Notes (Signed)
CSW went to pt room to discuss paramedicine program and complete assessment for outpatient paramedicine services.  Pt asleep and did not rouse but CSW able to speak briefly with pt fiance Paul Mccall- will plan to follow up in person during initial clinic visit   CSW will continue to follow and assist as needed  Burna Sis, LCSW Clinical Social Worker [facility] [phone]

## 2018-07-15 NOTE — Progress Notes (Signed)
Nutrition Education Note  RD consulted for nutrition education regarding new onset CHF.  RD provided "Low Sodium Nutrition Therapy" handout from the Academy of [employer]etics. Reviewed patient's dietary recall. Provided examples on ways to decrease sodium intake in diet. Discouraged intake of processed foods and use of salt shaker. Encouraged fresh fruits and vegetables as well as whole grain sources of carbohydrates to maximize fiber intake.   RD discussed why it is important for patient to adhere to diet recommendations, and emphasized the role of fluids, foods to avoid, and importance of weighing self daily. Teach back method used.  Expect good compliance.  Body mass index is 23.06 kg/m. Pt meets criteria for normal based on current BMI.  Current diet order is heart healthy with 1500 mL fluid restriction, patient is consuming approximately 100% of meals at this time. Labs and medications reviewed. No further nutrition interventions warranted at this time. RD contact information provided. If additional nutrition issues arise, please re-consult RD.   Jolaine Artist, MS, RDN, LDN Pager: [phone] Available Mondays and Fridays, 9am-2pm

## 2018-07-15 NOTE — Progress Notes (Addendum)
Advanced Heart Failure Rounding Note  PCP-Cardiologist: No primary care provider on file.   Subjective:    Remains on milrinone 0.375 mcg/kg/min. Coox 66%. SBP 100s. Losartan and spiro increased yesterday.   Remains on heparin for LV thrombus.  Diuresed well with 80 mg IV lasix BID yesterday. Weight unchanged today. Creatinine trending up 1.14 -> 1.27. CVP 2.  Denies CP or SOB. Slept really well with seroquel. Has not gotten OOB. Does not have a plan for after discharge yet.   Echo 07/14/18: EF 15-20%, dilated CM, anteroseptal, inferoseptal, and inferior akinesis. Otherwise severe global hypokinesis, grade 3 DD, 2.6 cm mobile thrombus at apical lateral myocardium, severely dilated LA, mildly dilated RA, moderate MR, mild calcification of aortic valve  Objective:   Weight Range: 68.8 kg Body mass index is 23.06 kg/m.   Vital Signs:   Temp:  [97.8 F (36.6 C)-98 F (36.7 C)] 98 F (36.7 C) (02/07 0843) Pulse Rate:  [95-105] 97 (02/07 0847) Resp:  [20-21] 20 (02/07 0843) BP: (100-105)/(75-79) 100/75 (02/07 0317) SpO2:  [97 %-99 %] 97 % (02/07 0317) Weight:  [68.8 kg] 68.8 kg (02/07 0317) Last BM Date: 07/14/18  Weight change: Filed Weights   07/13/18 0538 07/14/18 0500 07/15/18 0317  Weight: 70 kg 68.8 kg 68.8 kg    Intake/Output:   Intake/Output Summary (Last 24 hours) at 07/15/2018 0927 Last data filed at 07/15/2018 0844 Gross per 24 hour  Intake 1606.74 ml  Output 3575 ml  Net -1968.26 ml      Physical Exam    General: Appears fatigued. No resp difficulty. HEENT: Normal Neck: Supple. JVP flat. Carotids 2+ bilat; no bruits. No thyromegaly or nodule noted. Cor: PMI nondisplaced. Tachy, regular, +s3 Lungs: CTAB, normal effort. Abdomen: Soft, RUQ tender, non-distended, no HSM. No bruits or masses. +BS  Extremities: No cyanosis, clubbing, or rash. R and LLE no edema.  Neuro: Alert & orientedx3, cranial nerves grossly intact. moves all 4 extremities w/o  difficulty. Affect pleasant  Telemetry   NSR/Sinus tach 90-100s with LBBB. Personally reviewed.   EKG    No new tracings.   Labs    CBC Recent Labs    07/14/18 0522 07/15/18 0409  WBC 11.3* 9.8  HGB 15.8 16.2  HCT 47.0 50.7  MCV 85.5 85.8  PLT 378 396   Basic Metabolic Panel Recent Labs    20/35/59 0522 07/15/18 0409  NA 136 134*  K 3.7 4.1  CL 96* 98  CO2 26 26  GLUCOSE 103* 143*  BUN 14 14  CREATININE 1.14 1.27*  CALCIUM 9.0 8.9  MG 2.0 2.2   Liver Function Tests No results for input(s): AST, ALT, ALKPHOS, BILITOT, PROT, ALBUMIN in the last 72 hours. No results for input(s): LIPASE, AMYLASE in the last 72 hours. Cardiac Enzymes No results for input(s): CKTOTAL, CKMB, CKMBINDEX, TROPONINI in the last 72 hours.  BNP: BNP (last 3 results) Recent Labs    07/11/18 1411  BNP 1,099.3*    ProBNP (last 3 results) No results for input(s): PROBNP in the last 8760 hours.   D-Dimer No results for input(s): DDIMER in the last 72 hours. Hemoglobin A1C No results for input(s): HGBA1C in the last 72 hours. Fasting Lipid Panel No results for input(s): CHOL, HDL, LDLCALC, TRIG, CHOLHDL, LDLDIRECT in the last 72 hours. Thyroid Function Tests No results for input(s): TSH, T4TOTAL, T3FREE, THYROIDAB in the last 72 hours.  Invalid input(s): FREET3  Other results:   Imaging  No results found.   Medications:     Scheduled Medications: . digoxin  0.125 mg Oral Daily  . losartan  12.5 mg Oral BID  . QUEtiapine  50 mg Oral QHS  . sodium chloride flush  3 mL Intravenous Once  . sodium chloride flush  3 mL Intravenous Q12H  . spironolactone  25 mg Oral QHS    Infusions: . sodium chloride    . heparin 1,550 Units/hr (07/15/18 0918)  . milrinone 0.375 mcg/kg/min (07/15/18 0846)    PRN Medications: sodium chloride, albuterol, clonazePAM, guaiFENesin-dextromethorphan, ondansetron **OR** ondansetron (ZOFRAN) IV, sodium chloride flush, sodium chloride  flush    Patient Profile   Suresh Mitch is a 47 y.o. male with a history of chronic systolic heart failure (EF30%) due to NICM, HTN, bipolar disorder, cocaine abuse, tobacco abuse, and medication noncompliance.   Admitted with SOB, CP, and abdominal pain.   Assessment/Plan   1. Acute on chronic systolic HF due to NICM from cocaine abuse. No ICD.  - LHC 2018 no significant coronary disease - Echo 09/2017: EF 30-35%. Echo 2/20: EF 15-20%. - Recent RHC (06/09/18) with CO 4.1, CI 2.24 - Volume much improved. CVP 2. - Hold diuresis today. Creatinine 1.14 -> 1.27. He was taking lasix 40 mg BID PTA. Will likely need torsemide. - Coox 66%. Decrease milrinone to 0.25 mcg/kg/min.  - Continue spiro 25 mg daily - Continue digoxin 0.125 mg daily - Continue losartan 12.5 mg BID. SBP 100s. - No BB with low output - Echo 07/14/18: EF 15-20%, dilated CM, anteroseptal, inferoseptal, and inferior akinesis. Otherwise severe global hypokinesis, grade 3 DD, 2.6 cm mobile thrombus at apical lateral myocardium, severely dilated LA, mildly dilated RA, moderate MR, mild calcification of aortic valve. RV mild to moderate HK.  - He is not currently a home inotrope or advanced therapies candidate.  - Would eventually qualify for CRT-D if he is able to stop cocaine and be compliant with medications/follow up.  - Consult cardiac rehab - Consult RD  2. Chest Pain - LHC 2018 with no significant CAD - Troponin 0.03. - EKG with LBBB. New as of 06/09/18. - He says he has had CP 1x/week for several years. Seems to be associated with stress. No associated symptoms. No chest pain.   3. LV thrombus - Continue heparin drip for now. Consider starting coumadin tonight.  - Pt is concerned about affording AC at discharge. Will plan for coumadin. Not sure how he will get INR checks. He is unsure what city he will be living in.  4. LBBB - New as of 06/2018. Has not had new ischemic work up. Troponin 0.03. Thought to be  secondary to worsening cardiomyopathy when evaluated at [facility]. No change.   5. Abdominal pain - Likely to low output. Abdominal US negative. Has liver congestion on exam. No change.   6. Cocaine abuse - Last use 2/1. Discussed abstinence. No change.   7. Tobacco use - Smokes 1-2 cigarettes/week currently. Encouraged cessation. No change.   8. Bipolar disorder - He is not currently taking any medications. Says he stopped when he started using cocaine again - He needs to be set up with PCP.  - Seroquel started yesterday. May be an issue with getting after DC.   9. NSVT - K 4.1. Supp. Mag 2.2  10. Social issues - HF CSW aware of him. Has not yet seen. - He will need meds prior to DC through HF fund.  - We talked about where  he is planning on living (what city) again today. He is not sure yet.  Medication concerns reviewed with patient and pharmacy team. Barriers identified: compliance, drug abuse, no PCP, transportation issues, unsure where he will be living longterm.   Length of Stay: 4  Alford Highland, NP  07/15/2018, 9:27 AM  Advanced Heart Failure Team Pager [phone] (M-F; 7a - 4p)  Please contact [facility] for night-coverage after hours (4p -7a ) and weekends on [email or web address]  Patient seen and examined with the above-signed Advanced Practice Provider and/or Housestaff. I personally reviewed laboratory data, imaging studies and relevant notes. I independently examined the patient and formulated the important aspects of the plan. I have edited the note to reflect any of my changes or salient points. I have personally discussed the plan with the patient and/or family.  He remains on IV milrinone 0.375. Co-ox 66%. CVP down to 2. Feeling better. Still weak at times. Anxiety improved with Seroquel. Electrolytes ok.   Will stop lasix. Wean milrinone to 0.25. On heparin. Start warfarin for LV thrombus. Will wean milrinone over the weekend. Possibly home on Monday.   Arvilla Meres, MD  2:40 PM

## 2018-07-16 DIAGNOSIS — I5043 Acute on chronic combined systolic (congestive) and diastolic (congestive) heart failure: Secondary | ICD-10-CM

## 2018-07-16 LAB — BASIC METABOLIC PANEL
Anion gap: 8 (ref 5–15)
BUN: 13 mg/dL (ref 6–20)
CO2: 23 mmol/L (ref 22–32)
Calcium: 8.1 mg/dL — ABNORMAL LOW (ref 8.9–10.3)
Chloride: 105 mmol/L (ref 98–111)
Creatinine, Ser: 1.27 mg/dL — ABNORMAL HIGH (ref 0.61–1.24)
GFR calc Af Amer: >60 mL/min (ref >60)
Glucose, Bld: 108 mg/dL — ABNORMAL HIGH (ref 70–99)
Potassium: 4.6 mmol/L (ref 3.5–5.1)
Sodium: 136 mmol/L (ref 135–145)

## 2018-07-16 LAB — PROTIME-INR
INR: 1.37
Prothrombin Time: 16.7 seconds — ABNORMAL HIGH (ref 11.4–15.2)

## 2018-07-16 LAB — CBC
HEMATOCRIT: 48.2 % (ref 39.0–52.0)
Hemoglobin: 15 g/dL (ref 13.0–17.0)
MCH: 27.5 pg (ref 26.0–34.0)
MCHC: 31.1 g/dL (ref 30.0–36.0)
MCV: 88.4 fL (ref 80.0–100.0)
Platelets: 358 10*3/uL (ref 150–400)
RBC: 5.45 MIL/uL (ref 4.22–5.81)
RDW: 13.9 % (ref 11.5–15.5)
WBC: 9.3 10*3/uL (ref 4.0–10.5)
nRBC: 0 % (ref 0.0–0.2)

## 2018-07-16 LAB — COOXEMETRY PANEL
Carboxyhemoglobin: 1.4 % (ref 0.5–1.5)
Methemoglobin: 1.4 % (ref 0.0–1.5)
O2 Saturation: 70.6 %
Total hemoglobin: 15.4 g/dL (ref 12.0–16.0)

## 2018-07-16 LAB — HEPARIN LEVEL (UNFRACTIONATED): Heparin Unfractionated: 0.56 IU/mL (ref 0.30–0.70)

## 2018-07-16 LAB — MAGNESIUM: Magnesium: 2.3 mg/dL (ref 1.7–2.4)

## 2018-07-16 MED ORDER — WARFARIN SODIUM 7.5 MG PO TABS
7.5000 mg | ORAL_TABLET | Freq: Once | ORAL | Status: AC
  Administered 2018-07-16: 7.5 mg via ORAL
  Filled 2018-07-16: qty 1

## 2018-07-16 MED ORDER — FUROSEMIDE 10 MG/ML IJ SOLN
80.0000 mg | Freq: Two times a day (BID) | INTRAMUSCULAR | Status: DC
  Administered 2018-07-16 – 2018-07-17 (×3): 80 mg via INTRAVENOUS
  Filled 2018-07-16 (×3): qty 8

## 2018-07-16 MED ORDER — LOSARTAN POTASSIUM 25 MG PO TABS
25.0000 mg | ORAL_TABLET | Freq: Two times a day (BID) | ORAL | Status: DC
  Administered 2018-07-16 – 2018-07-19 (×6): 25 mg via ORAL
  Filled 2018-07-16 (×6): qty 1

## 2018-07-16 NOTE — Progress Notes (Signed)
ANTICOAGULATION CONSULT NOTE - Follow-Up Consult  Pharmacy Consult for heparin + warfarin Indication: LV Thrombus  No Known Allergies  Patient Measurements: Height: 5\' 8"  (172.7 cm) Weight: 155 lb 1.6 oz (70.4 kg) IBW/kg (Calculated) : 68.4 Heparin Dosing Weight: 70kg  Vital Signs: Temp: 98.2 F (36.8 C) (02/08 0806) Temp Source: Oral (02/08 0806) BP: 109/79 (02/08 0806) Pulse Rate: 98 (02/08 0806)  Labs: Recent Labs    07/14/18 0522  07/15/18 0409 07/15/18 0726 07/16/18 0500 07/16/18 1005  HGB 15.8  --  16.2  --  15.0  --   HCT 47.0  --  50.7  --  48.2  --   PLT 378  --  396  --  358  --   LABPROT  --   --   --   --  16.7*  --   INR  --   --   --   --  1.37  --   HEPARINUNFRC  --    < > 2.20* 0.76*  --  0.56  CREATININE 1.14  --  1.27*  --  1.27*  --    < > = values in this interval not displayed.    Estimated Creatinine Clearance: 69.6 mL/min (A) (by C-G formula based on SCr of 1.27 mg/dL (H)).   Medical History: Past Medical History:  Diagnosis Date  . CHF (congestive heart failure) (HCC)   . Dental infection   . Enlarged heart   . Hernia, inguinal, right   . Hypertension      Assessment: 58 yoM with advanced HF found to have LV thrombus on ECHO. Pharmacy dosing heparin and coumadin (day 2 overlap) -heparin level= 0.56, INR= 1.37  Goal of Therapy:  Heparin level 0.3-0.7 units/ml Monitor platelets by anticoagulation protocol: Yes   Plan:  -No heparin changes needed -Recheck heparin level with daily labs -Warfarin 7.5mg  PO x1 tonight -Daily heparin level, CBC, INR  Harland German, PharmD Clinical Pharmacist **Pharmacist phone directory can now be found on [email or web address] ([email or web address]).  Listed under [facility].

## 2018-07-16 NOTE — Progress Notes (Signed)
PROGRESS NOTE    Paul OleaSteven Mccall  ZOX:[id number] DOB: [birth year] DOA: 07/11/2018 PCP: Patient, No Pcp Per   Brief Narrative: Patient is a 48 year old male with history of severe nonischemic cardiomyopathy with ejection fraction of 20%, cocaine abuse , noncompliance who presents to the emergency department with complaints of chest pain or shortness of breath.  Patient had a cardiac cath on 06/09/2018 with pain which showed an ejection fraction of less than 20%.  [place]  He was also complaining of right upper quadrant pain.  Found to have elevated BNP on presentation.  Chest ray showed cardiomegaly.  Cardiology consulted for severe congestive heart failure.  07/15/2018: Patient seen.  No new complaints.  Cardiology (CHF team) managing.  No chest pain.  No shortness of breath.  07/16/2018: Patient seen.  Reports worsening of shortness of breath today.  Cardiology input appreciated.  IV Lasix 80 mg twice daily has been added to patient's congestive heart failure regimen.  Cardiology team is directing care.  Assessment & Plan:   Active Problems:   Congestive heart failure (CHF) (HCC)   Acute on congestive heart failure: History of systolic heart failure thought to be secondary to nonischemic cardiomyopathy.  Ejection fraction of 20%  as per  card[facility].  Cardiology consulted and following.  Continued on IV Lasix.  Continue input/output monitoring.  Elevated  BNP on presentation. Also started on milrinone, spironolactone and digoxin.  Echocardiogram done here showed ejection fraction of 15 to 20%.,  Dilated cardiomyopathy, grade 3 diastolic dysfunction.  Severe hypokinesis Having good diuresis. Plan is for CRT if he gives up drug abuse and noncompliance. 07/15/2018: Patient seen.  No new complaints.  Cardiology (CHF team) managing.  No chest pain.  No shortness of breath. 07/16/2018: IV Lasix 80 mg twice daily added to the regimen.  Continue management as per cardiology  team.  Apical thrombus: 2.6cm mobile thrombus noted at the apical lateral myocardium asper Echo.  Started on heparin drip. Oral anticoagulation as per cardiology.  Hypertension: Currently blood pressure stable.  Chest pain/Right upper quadrant pain: Chest pain has resolved.  Abdomen ultrasound did not show any cholelithiasis or any signs of cholecystitis.  Still complains of right upper quadrant pain and there is RUQ tenderness.  This is most likely tender hepatomegaly from congestive heart failure. 07/15/2018: Resolved.  CKD stage II: Currently stable.  Monitor with diuresis.  Hypokalemia: Supplemented with potassium. 07/15/2018: Resolved.  Cocaine abuse/smoker:  Snorts/smokes cocaine.  Counseled for cessation.  Bipolar disorder/anxiety disorder:  Continue Klonopin for now.  He needs to follow-up with psychiatry as an outpatient after discharge. 07/15/2018: Stable.   DVT prophylaxis: Lovenox Code Status: Full Family Communication: Wife present at bedside Disposition Plan: Home after stabilization of CHF status   Consultants: Cardiology  Procedures: None  Antimicrobials:  Anti-infectives (From admission, onward)   None      Subjective: Worsening shortness of breath.   No chest pain.  Objective: Vitals:   07/16/18 0040 07/16/18 0551 07/16/18 0806 07/16/18 1216  BP: 108/83 103/77 109/79 (!) 122/95  Pulse: 96 99 98 98  Resp: 17 17    Temp: 97.9 F (36.6 C) 98.7 F (37.1 C) 98.2 F (36.8 C) 97.7 F (36.5 C)  TempSrc: Oral Oral Oral Oral  SpO2: 97% 100% 99% 98%  Weight:  70.4 kg    Height:        Intake/Output Summary (Last 24 hours) at 07/16/2018 1323 Last data filed at 07/16/2018 1200  Gross per 24 hour  Intake 703.63 ml  Output 2410 ml  Net -1706.37 ml   Filed Weights   07/14/18 0500 07/15/18 0317 07/16/18 0551  Weight: 68.8 kg 68.8 kg 70.4 kg    Examination:  General exam: Not in any obvious distress.  Awake and alert. HEENT:PERRL,Oral mucosa moist,  Ear/Nose normal on gross exam.  Query elevated JVD. Respiratory system: Clear to auscultation. Cardiovascular system: S1 & S2 heard Gastrointestinal system: Abdomen is nondistended, soft.  Right upper quadrant tenderness Central nervous system: Alert and oriented. No focal neurological deficits. Extremities: Fullness of the ankle.  Data Reviewed: I have personally reviewed following labs and imaging studies  CBC: Recent Labs  Lab 07/11/18 1411 07/13/18 0422 07/14/18 0522 07/15/18 0409 07/16/18 0500  WBC 12.9* 12.2* 11.3* 9.8 9.3  HGB 13.8 14.3 15.8 16.2 15.0  HCT 44.3 44.8 47.0 50.7 48.2  MCV 89.0 88.0 85.5 85.8 88.4  PLT 327 359 378 396 358   Basic Metabolic Panel: Recent Labs  Lab 07/12/18 0455 07/13/18 0422 07/14/18 0522 07/15/18 0409 07/16/18 0500  NA 140 137 136 134* 136  K 3.1* 3.7 3.7 4.1 4.6  CL 100 98 96* 98 105  CO2 28 28 26 26 23   GLUCOSE 103* 109* 103* 143* 108*  BUN 16 17 14 14 13   CREATININE 1.40* 1.22 1.14 1.27* 1.27*  CALCIUM 8.7* 8.9 9.0 8.9 8.1*  MG 1.8 2.2 2.0 2.2 2.3   GFR: Estimated Creatinine Clearance: 69.6 mL/min (A) (by C-G formula based on SCr of 1.27 mg/dL (H)). Liver Function Tests: Recent Labs  Lab 07/11/18 1724  AST 52*  ALT 63*  ALKPHOS 107  BILITOT 0.6  PROT 6.8  ALBUMIN 3.2*   Recent Labs  Lab 07/11/18 1724  LIPASE 32   No results for input(s): AMMONIA in the last 168 hours. Coagulation Profile: Recent Labs  Lab 07/16/18 0500  INR 1.37   Cardiac Enzymes: No results for input(s): CKTOTAL, CKMB, CKMBINDEX, TROPONINI in the last 168 hours. BNP (last 3 results) No results for input(s): PROBNP in the last 8760 hours. HbA1C: No results for input(s): HGBA1C in the last 72 hours. CBG: No results for input(s): GLUCAP in the last 168 hours. Lipid Profile: No results for input(s): CHOL, HDL, LDLCALC, TRIG, CHOLHDL, LDLDIRECT in the last 72 hours. Thyroid Function Tests: No results for input(s): TSH, T4TOTAL, FREET4,  T3FREE, THYROIDAB in the last 72 hours. Anemia Panel: No results for input(s): VITAMINB12, FOLATE, FERRITIN, TIBC, IRON, RETICCTPCT in the last 72 hours. Sepsis Labs: No results for input(s): PROCALCITON, LATICACIDVEN in the last 168 hours.  No results found for this or any previous visit (from the past 240 hour(s)).       Radiology Studies: No results found.      Scheduled Meds: . digoxin  0.125 mg Oral Daily  . furosemide  80 mg Intravenous BID  . losartan  25 mg Oral BID  . QUEtiapine  50 mg Oral QHS  . sodium chloride flush  3 mL Intravenous Once  . sodium chloride flush  3 mL Intravenous Q12H  . spironolactone  25 mg Oral QHS  . warfarin  7.5 mg Oral ONCE-1800  . Warfarin - Pharmacist Dosing Inpatient   Does not apply q1800   Continuous Infusions: . sodium chloride    . heparin 1,550 Units/hr (07/15/18 1332)  . milrinone 0.125 mcg/kg/min (07/16/18 1123)     LOS: 5 days    Time spent: 25 mins.  Barnetta Chapel, MD  [facility] Pager [phone]  If 7PM-7AM, please contact night-coverage [email or web address] Password [email or web address] 07/16/2018, 1:23 PM

## 2018-07-16 NOTE — Progress Notes (Signed)
Patient ID: Paul Mccall, male   DOB: [birth year], 48 y.o.   MRN: [id number]     Advanced Heart Failure Rounding Note  PCP-Cardiologist: No primary care provider on file.   Subjective:    Remains on milrinone 0.25 mcg/kg/min. Coox 71%. SBP 100s.    Warfarin with heparin bridge for LV thrombus.  No Lasix yesterday, feels like he is filling up with fluid again but able to lay back flat.  CVP up to 12-13 today.    Echo 07/14/18: EF 15-20%, dilated CM, anteroseptal, inferoseptal, and inferior akinesis. Otherwise severe global hypokinesis, grade 3 DD, 2.6 cm mobile thrombus at apical lateral myocardium, severely dilated LA, mildly dilated RA, moderate MR, mild calcification of aortic valve  Objective:   Weight Range: 70.4 kg Body mass index is 23.58 kg/m.   Vital Signs:   Temp:  [97.5 F (36.4 C)-98.7 F (37.1 C)] 98.2 F (36.8 C) (02/08 0806) Pulse Rate:  [94-99] 98 (02/08 0806) Resp:  [15-20] 17 (02/08 0551) BP: (103-112)/(77-83) 109/79 (02/08 0806) SpO2:  [96 %-100 %] 99 % (02/08 0806) Weight:  [70.4 kg] 70.4 kg (02/08 0551) Last BM Date: 07/14/18  Weight change: Filed Weights   07/14/18 0500 07/15/18 0317 07/16/18 0551  Weight: 68.8 kg 68.8 kg 70.4 kg    Intake/Output:   Intake/Output Summary (Last 24 hours) at 07/16/2018 1046 Last data filed at 07/16/2018 0939 Gross per 24 hour  Intake 1111.15 ml  Output 1860 ml  Net -748.85 ml      Physical Exam    General: NAD Neck: JVP 10-12 cm, no thyromegaly or thyroid nodule.  Lungs: Clear to auscultation bilaterally with normal respiratory effort. CV: Lateral PMI.  Heart regular S1/S2, no S3/S4, 3/6 HSM apex.  No peripheral edema.   Abdomen: Soft, nontender, no hepatosplenomegaly, no distention.  Skin: Intact without lesions or rashes.  Neurologic: Alert and oriented x 3.  Psych: Normal affect. Extremities: No clubbing or cyanosis.  HEENT: Normal.    Telemetry   NSR/Sinus tach 90-100s with LBBB. Personally reviewed.     EKG    No new tracings.   Labs    CBC Recent Labs    07/15/18 0409 07/16/18 0500  WBC 9.8 9.3  HGB 16.2 15.0  HCT 50.7 48.2  MCV 85.8 88.4  PLT 396 358   Basic Metabolic Panel Recent Labs    07/37/10 0409 07/16/18 0500  NA 134* 136  K 4.1 4.6  CL 98 105  CO2 26 23  GLUCOSE 143* 108*  BUN 14 13  CREATININE 1.27* 1.27*  CALCIUM 8.9 8.1*  MG 2.2 2.3   Liver Function Tests No results for input(s): AST, ALT, ALKPHOS, BILITOT, PROT, ALBUMIN in the last 72 hours. No results for input(s): LIPASE, AMYLASE in the last 72 hours. Cardiac Enzymes No results for input(s): CKTOTAL, CKMB, CKMBINDEX, TROPONINI in the last 72 hours.  BNP: BNP (last 3 results) Recent Labs    07/11/18 1411  BNP 1,099.3*    ProBNP (last 3 results) No results for input(s): PROBNP in the last 8760 hours.   D-Dimer No results for input(s): DDIMER in the last 72 hours. Hemoglobin A1C No results for input(s): HGBA1C in the last 72 hours. Fasting Lipid Panel No results for input(s): CHOL, HDL, LDLCALC, TRIG, CHOLHDL, LDLDIRECT in the last 72 hours. Thyroid Function Tests No results for input(s): TSH, T4TOTAL, T3FREE, THYROIDAB in the last 72 hours.  Invalid input(s): FREET3  Other results:   Imaging    No results  found.   Medications:     Scheduled Medications: . digoxin  0.125 mg Oral Daily  . losartan  25 mg Oral BID  . QUEtiapine  50 mg Oral QHS  . sodium chloride flush  3 mL Intravenous Once  . sodium chloride flush  3 mL Intravenous Q12H  . spironolactone  25 mg Oral QHS  . Warfarin - Pharmacist Dosing Inpatient   Does not apply q1800    Infusions: . sodium chloride    . heparin 1,550 Units/hr (07/15/18 1332)  . milrinone 0.25 mcg/kg/min (07/15/18 0955)    PRN Medications: sodium chloride, albuterol, clonazePAM, guaiFENesin-dextromethorphan, ondansetron **OR** ondansetron (ZOFRAN) IV, sodium chloride flush, sodium chloride flush    Patient Profile   Paul Mccall is a 48 y.o. male with a history of chronic systolic heart failure (EF30%) due to NICM, HTN, bipolar disorder, cocaine abuse, tobacco abuse, and medication noncompliance.   Admitted with SOB, CP, and abdominal pain.   Assessment/Plan   1. Acute on chronic systolic HF due to NICM from cocaine abuse: No ICD. LHC 2018 no significant coronary disease. Echo 09/2017: EF 30-35%. Echo 2/20: EF 15-20%. Recent RHC (06/09/18) with CO 4.1, CI 2.24. Echo 07/14/18: EF 15-20%, dilated CM, anteroseptal, inferoseptal, and inferior akinesis. Otherwise severe global hypokinesis, grade 3 DD, 2.6 cm mobile thrombus at apical lateral myocardium, severely dilated LA, mildly dilated RA, moderate MR, mild calcification of aortic valve. RV mild to moderate HK.  No Lasix yesterday, CVP up to 12-13 and feels worse.  Volume overload on exam. Co-ox 71%.  - Lasix 80 mg IV bid today, hopefully to torsemide tomorrow.  - Think we can decrease milrinone to 0.125 mcg/kg/min.   - Continue spiro 25 mg daily - Continue digoxin 0.125 mg daily - Increase losartan to 25 mg daily with fairly stable BP and creatinine.  - No BB with low output - He is not currently a home inotrope or advanced therapies candidate.  - Would eventually qualify for CRT-D if he is able to stop cocaine and be compliant with medications/follow up.  2. LV thrombus: On heparin bridge to therapeutic warfarin.  3. LBBB: New as of 06/2018. Has not had new ischemic work up. Troponin 0.03. Thought to be secondary to worsening cardiomyopathy when evaluated at [facility]. No change.  4. Abdominal pain: Likely due to low output. Abdominal US negative. Has liver congestion on exam. No change.  5. Cocaine abuse: Last use 2/1. Discussed abstinence. No change.  6. Tobacco use: Smokes 1-2 cigarettes/week currently. Encouraged cessation. No change.  7. Bipolar disorder: Started on Seroquel this admission, was not on meds at time of admission.  - Needs psych/PCP followup.  8. NSVT:  Quiescent.  9. Social issues - HF CSW aware of him. Has not yet seen. - He will need meds prior to DC through HF fund.  - We talked about where he is planning on living (what city) again today. He is not sure yet.  Length of Stay: 5  Marca Ancona, MD  07/16/2018, 10:46 AM  Advanced Heart Failure Team Pager [phone] (M-F; 7a - 4p)  Please contact [facility] for night-coverage after hours (4p -7a ) and weekends on [email or web address]

## 2018-07-17 LAB — PROTIME-INR
INR: 1.45
INR: 1.42
Prothrombin Time: 17.5 seconds — ABNORMAL HIGH (ref 11.4–15.2)
Prothrombin Time: 17.2 seconds — ABNORMAL HIGH (ref 11.4–15.2)

## 2018-07-17 LAB — BASIC METABOLIC PANEL
ANION GAP: 10 (ref 5–15)
BUN: 18 mg/dL (ref 6–20)
CO2: 23 mmol/L (ref 22–32)
Calcium: 8.9 mg/dL (ref 8.9–10.3)
Chloride: 99 mmol/L (ref 98–111)
Creatinine, Ser: 1.09 mg/dL (ref 0.61–1.24)
GFR calc Af Amer: >60 mL/min (ref >60)
GFR calc non Af Amer: >60 mL/min (ref >60)
Glucose, Bld: 106 mg/dL — ABNORMAL HIGH (ref 70–99)
Potassium: 4.6 mmol/L (ref 3.5–5.1)
Sodium: 132 mmol/L — ABNORMAL LOW (ref 135–145)

## 2018-07-17 LAB — CBC
HEMATOCRIT: 53.6 % — AB (ref 39.0–52.0)
Hemoglobin: 16.8 g/dL (ref 13.0–17.0)
MCH: 28.1 pg (ref 26.0–34.0)
MCHC: 31.3 g/dL (ref 30.0–36.0)
MCV: 89.6 fL (ref 80.0–100.0)
Platelets: 365 10*3/uL (ref 150–400)
RBC: 5.98 MIL/uL — ABNORMAL HIGH (ref 4.22–5.81)
RDW: 14 % (ref 11.5–15.5)
WBC: 10.5 10*3/uL (ref 4.0–10.5)
nRBC: 0 % (ref 0.0–0.2)

## 2018-07-17 LAB — HEPARIN LEVEL (UNFRACTIONATED)
Heparin Unfractionated: >2.2 IU/mL — ABNORMAL HIGH (ref 0.30–0.70)
Heparin Unfractionated: 0.64 IU/mL (ref 0.30–0.70)

## 2018-07-17 LAB — COOXEMETRY PANEL
Carboxyhemoglobin: 1.3 % (ref 0.5–1.5)
Methemoglobin: 0.9 % (ref 0.0–1.5)
O2 Saturation: 59.7 %
Total hemoglobin: 17.4 g/dL — ABNORMAL HIGH (ref 12.0–16.0)

## 2018-07-17 LAB — MAGNESIUM: Magnesium: 2.4 mg/dL (ref 1.7–2.4)

## 2018-07-17 LAB — DIGOXIN LEVEL: DIGOXIN LVL: 0.2 ng/mL — AB (ref 0.8–2.0)

## 2018-07-17 MED ORDER — TORSEMIDE 20 MG PO TABS
40.0000 mg | ORAL_TABLET | Freq: Every day | ORAL | Status: DC
  Administered 2018-07-18: 40 mg via ORAL
  Filled 2018-07-17: qty 2

## 2018-07-17 MED ORDER — MORPHINE SULFATE (PF) 2 MG/ML IV SOLN
2.0000 mg | Freq: Once | INTRAVENOUS | Status: AC
  Administered 2018-07-17: 2 mg via INTRAVENOUS
  Filled 2018-07-17: qty 1

## 2018-07-17 MED ORDER — WARFARIN SODIUM 7.5 MG PO TABS
7.5000 mg | ORAL_TABLET | Freq: Once | ORAL | Status: AC
  Administered 2018-07-17: 7.5 mg via ORAL
  Filled 2018-07-17: qty 1

## 2018-07-17 NOTE — Progress Notes (Signed)
Patient ID: Maximillian Shindle, male   DOB: Nov 09, 1970, 48 y.o.   MRN: [id number]     Advanced Heart Failure Rounding Note  PCP-Cardiologist: No primary care provider on file.   Subjective:    Remains on milrinone 0.125 mcg/kg/min. Coox 60%. SBP 100s.    Warfarin with heparin bridge for LV thrombus.  Good diuresis yesterday with IV Lasix, CVP down to 5.  Weight was done in bed so not accurate.    Echo 07/14/18: EF 15-20%, dilated CM, anteroseptal, inferoseptal, and inferior akinesis. Otherwise severe global hypokinesis, grade 3 DD, 2.6 cm mobile thrombus at apical lateral myocardium, severely dilated LA, mildly dilated RA, moderate MR, mild calcification of aortic valve  Objective:   Weight Range: 70.4 kg Body mass index is 23.58 kg/m.   Vital Signs:   Temp:  [97.7 F (36.5 C)-98.4 F (36.9 C)] 97.7 F (36.5 C) (02/09 0430) Pulse Rate:  [77-100] 100 (02/09 0430) Resp:  [18] 18 (02/08 2000) BP: (104-122)/(84-95) 104/84 (02/09 0430) SpO2:  [98 %-99 %] 98 % (02/09 0430) Last BM Date: 07/15/18  Weight change: Filed Weights   07/14/18 0500 07/15/18 0317 07/16/18 0551  Weight: 68.8 kg 68.8 kg 70.4 kg    Intake/Output:   Intake/Output Summary (Last 24 hours) at 07/17/2018 0908 Last data filed at 07/17/2018 0500 Gross per 24 hour  Intake 1765.94 ml  Output 4075 ml  Net -2309.06 ml      Physical Exam    General: NAD Neck: No JVD, no thyromegaly or thyroid nodule.  Lungs: Clear to auscultation bilaterally with normal respiratory effort. CV: Lateral PMI.  Heart regular S1/S2, no S3/S4, 1/6 HSM apex.  No peripheral edema.  Abdomen: Soft, nontender, no hepatosplenomegaly, no distention.  Skin: Intact without lesions or rashes.  Neurologic: Alert and oriented x 3.  Psych: Normal affect. Extremities: No clubbing or cyanosis.  HEENT: Normal.    Telemetry   NSR 90s with LBBB. Personally reviewed.   EKG    No new tracings.   Labs    CBC Recent Labs    07/16/18 0500  07/17/18 0452  WBC 9.3 10.5  HGB 15.0 16.8  HCT 48.2 53.6*  MCV 88.4 89.6  PLT 358 365   Basic Metabolic Panel Recent Labs    29/93/71 0500 07/17/18 0452  NA 136 132*  K 4.6 4.6  CL 105 99  CO2 23 23  GLUCOSE 108* 106*  BUN 13 18  CREATININE 1.27* 1.09  CALCIUM 8.1* 8.9  MG 2.3 2.4   Liver Function Tests No results for input(s): AST, ALT, ALKPHOS, BILITOT, PROT, ALBUMIN in the last 72 hours. No results for input(s): LIPASE, AMYLASE in the last 72 hours. Cardiac Enzymes No results for input(s): CKTOTAL, CKMB, CKMBINDEX, TROPONINI in the last 72 hours.  BNP: BNP (last 3 results) Recent Labs    07/11/18 1411  BNP 1,099.3*    ProBNP (last 3 results) No results for input(s): PROBNP in the last 8760 hours.   D-Dimer No results for input(s): DDIMER in the last 72 hours. Hemoglobin A1C No results for input(s): HGBA1C in the last 72 hours. Fasting Lipid Panel No results for input(s): CHOL, HDL, LDLCALC, TRIG, CHOLHDL, LDLDIRECT in the last 72 hours. Thyroid Function Tests No results for input(s): TSH, T4TOTAL, T3FREE, THYROIDAB in the last 72 hours.  Invalid input(s): FREET3  Other results:   Imaging    No results found.   Medications:     Scheduled Medications: . digoxin  0.125 mg Oral Daily  .  losartan  25 mg Oral BID  . QUEtiapine  50 mg Oral QHS  . sodium chloride flush  3 mL Intravenous Once  . sodium chloride flush  3 mL Intravenous Q12H  . spironolactone  25 mg Oral QHS  . [START ON 07/18/2018] torsemide  40 mg Oral Daily  . Warfarin - Pharmacist Dosing Inpatient   Does not apply q1800    Infusions: . sodium chloride    . heparin 1,550 Units/hr (07/16/18 2054)    PRN Medications: sodium chloride, albuterol, clonazePAM, guaiFENesin-dextromethorphan, ondansetron **OR** ondansetron (ZOFRAN) IV, sodium chloride flush, sodium chloride flush    Patient Profile   Bryen Riedinger is a 48 y.o. male with a history of chronic systolic heart failure  (EF30%) due to NICM, HTN, bipolar disorder, cocaine abuse, tobacco abuse, and medication noncompliance.   Admitted with SOB, CP, and abdominal pain.   Assessment/Plan   1. Acute on chronic systolic HF due to NICM from cocaine abuse: No ICD. LHC 2018 no significant coronary disease. Echo 09/2017: EF 30-35%. Echo 2/20: EF 15-20%. Recent RHC (06/09/18) with CO 4.1, CI 2.24. Echo 07/14/18: EF 15-20%, dilated CM, anteroseptal, inferoseptal, and inferior akinesis. Otherwise severe global hypokinesis, grade 3 DD, 2.6 cm mobile thrombus at apical lateral myocardium, severely dilated LA, mildly dilated RA, moderate MR, mild calcification of aortic valve. RV mild to moderate HK.  Co-ox 67% on milrinone 0.125.  CVP down to 5, only has bed weight.  Good diuresis yesterday. Will not titrate up HF meds today with soft BP.  - Stop IV Lasix (got dose this morning).  Will start torsemide 40 mg daily tomorrow morning.  - Stop milrinone, repeat co-ox in am.    - Continue spiro 25 mg daily - Continue digoxin 0.125 mg daily - Continue losartan 25 mg bid.  - No BB with low output - He is not currently a home inotrope or advanced therapies candidate.  - Would eventually qualify for CRT-D if he is able to stop cocaine and be compliant with medications/follow up.  2. LV thrombus: On heparin bridge to therapeutic warfarin.  3. LBBB: New as of 06/2018. Has not had new ischemic work up. Troponin 0.03. Thought to be secondary to worsening cardiomyopathy when evaluated at [facility]. No change.  4. Abdominal pain: Likely due to low output. Abdominal US negative. Has liver congestion on exam. No change.  5. Cocaine abuse: Last use 2/1. Discussed abstinence. No change.  6. Tobacco use: Smokes 1-2 cigarettes/week currently. Encouraged cessation. No change.  7. Bipolar disorder: Started on Seroquel this admission, was not on meds at time of admission.  - Needs psych/PCP followup.  8. NSVT: Quiescent.  9. Social issues - HF CSW aware  of him. Has not yet seen. - He will need meds prior to DC through HF fund.  - We talked about where he is planning on living (what city) again today. He is not sure yet.  Length of Stay: 6  Marca Ancona, MD  07/17/2018, 9:08 AM  Advanced Heart Failure Team Pager [phone] (M-F; 7a - 4p)  Please contact [facility] for night-coverage after hours (4p -7a ) and weekends on [email or web address]

## 2018-07-17 NOTE — Plan of Care (Signed)
  Problem: Education: Goal: Knowledge of General Education information will improve Description: Including pain rating scale, medication(s)/side effects and non-pharmacologic comfort measures Outcome: Progressing   Problem: Health Behavior/Discharge Planning: Goal: Ability to manage health-related needs will improve Outcome: Progressing   Problem: Clinical Measurements: Goal: Will remain free from infection Outcome: Progressing Goal: Diagnostic test results will improve Outcome: Progressing Goal: Cardiovascular complication will be avoided Outcome: Progressing   

## 2018-07-17 NOTE — Plan of Care (Signed)
  Problem: Clinical Measurements: Goal: Ability to maintain clinical measurements within normal limits will improve Outcome: Progressing Goal: Will remain free from infection Outcome: Progressing Goal: Cardiovascular complication will be avoided Outcome: Progressing   

## 2018-07-17 NOTE — Progress Notes (Signed)
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  Patient had a cardiac cath on 06/09/2018 with pain which showed an ejection fraction of less than 20%.  He just moved to [place].  He was also complaining of right upper quadrant pain.  Found to have elevated BNP on presentation.  Chest ray showed cardiomegaly.  Cardiology consulted for severe congestive heart failure.  07/15/2018: Patient seen.  No new complaints.  Cardiology (CHF team) managing.  No chest pain.  No shortness of breath.  07/16/2018: Patient seen.  Reports worsening of shortness of breath today.  Cardiology input appreciated.  IV Lasix 80 mg twice daily has been added to patient's congestive heart failure regimen.  Cardiology team is directing care.  07/17/2018: Patient seen.  No new complaints.  IV Lasix is on hold.  CHF team is managing patient's congestive heart failure.  Input is highly appreciated.  No new complaints.  Assessment & Plan:   Active Problems:   Congestive heart failure (CHF) (HCC)   Acute on congestive heart failure: History of systolic heart failure thought to be secondary to nonischemic cardiomyopathy.  Ejection fraction of 20%  as per  cardiac cath on 2/20 at [facility].  Cardiology consulted and following.  Continued on IV Lasix.  Continue input/output monitoring.  Elevated  BNP on presentation. Also started on milrinone, spironolactone and digoxin.  Echocardiogram done here showed ejection fraction of 15 to 20%.,  Dilated cardiomyopathy, grade 3 diastolic dysfunction.  Severe hypokinesis Having good diuresis. Plan is for CRT if he gives up drug abuse and noncompliance. 07/15/2018: Patient seen.  No new  complaints.  Cardiology (CHF team) managing.  No chest pain.  No shortness of breath. 07/16/2018: IV Lasix 80 mg twice daily added to the regimen.  Continue management as per cardiology team. 07/17/2018: IV Lasix is discontinued.  CHF team is managing care.    Apical thrombus: 2.6cm mobile thrombus noted at the apical lateral myocardium asper Echo.  Started on heparin drip. Oral anticoagulation as per cardiology.  Hypertension: Currently blood pressure stable.  Chest pain/Right upper quadrant pain: Chest pain has resolved.  Abdomen ultrasound did not show any cholelithiasis or any signs of cholecystitis.  Still complains of right upper quadrant pain and there is RUQ tenderness.  This is most likely tender hepatomegaly from congestive heart failure. 07/15/2018: Resolved.  CKD stage II: Currently stable.  Monitor with diuresis.  Hypokalemia: Supplemented with potassium. 07/15/2018: Resolved.  Cocaine abuse/smoker:  Snorts/smokes cocaine.  Counseled for cessation.  Bipolar disorder/anxiety disorder:  Continue Klonopin for now.  He needs to follow-up with psychiatry as an outpatient after discharge. 07/15/2018: Stable.   DVT prophylaxis: Lovenox Code Status: Full Family Communication: Wife present at bedside Disposition Plan: Home after stabilization of CHF status   Consultants: Cardiology  Procedures: None  Antimicrobials:  Anti-infectives (From admission, onward)   None      Subjective: No shortness of breath.   No chest pain.  Objective: Vitals:   07/16/18 1216 07/16/18 1644 07/16/18 2000 07/17/18 0430  BP: (!) 122/95 111/88 110/87 104/84  Pulse: 98 99 77 100  Resp:   18   Temp: 97.7 F (36.5 C) 98.4 F (36.9 C) 98.4 F (36.9 C) 97.7  F (36.5 C)  TempSrc: Oral Oral Oral Oral  SpO2: 98% 99% 99% 98%  Weight:      Height:        Intake/Output Summary (Last 24 hours) at 07/17/2018 1504 Last data filed at 07/17/2018 1400 Gross per 24 hour  Intake 1207.19 ml  Output 3075  ml  Net -1867.81 ml   Filed Weights   07/14/18 0500 07/15/18 0317 07/16/18 0551  Weight: 68.8 kg 68.8 kg 70.4 kg    Examination:  General exam: Not in any obvious distress.  Awake and alert. HEENT:PERRL,Oral mucosa moist, Ear/Nose normal on gross exam.  No elevated JVD. Respiratory system: Decreased air entry globally.   Cardiovascular system: S1 & S2 heard Gastrointestinal system: Abdomen is nondistended, soft.  Right upper quadrant tenderness Central nervous system: Alert and oriented. No focal neurological deficits. Extremities: No leg edema.  Data Reviewed: I have personally reviewed following labs and imaging studies  CBC: Recent Labs  Lab 07/13/18 0422 07/14/18 0522 07/15/18 0409 07/16/18 0500 07/17/18 0452  WBC 12.2* 11.3* 9.8 9.3 10.5  HGB 14.3 15.8 16.2 15.0 16.8  HCT 44.8 47.0 50.7 48.2 53.6*  MCV 88.0 85.5 85.8 88.4 89.6  PLT 359 378 396 358 365   Basic Metabolic Panel: Recent Labs  Lab 07/13/18 0422 07/14/18 0522 07/15/18 0409 07/16/18 0500 07/17/18 0452  NA 137 136 134* 136 132*  K 3.7 3.7 4.1 4.6 4.6  CL 98 96* 98 105 99  CO2 28 26 26 23 23   GLUCOSE 109* 103* 143* 108* 106*  BUN 17 14 14 13 18   CREATININE 1.22 1.14 1.27* 1.27* 1.09  CALCIUM 8.9 9.0 8.9 8.1* 8.9  MG 2.2 2.0 2.2 2.3 2.4   GFR: Estimated Creatinine Clearance: 81.1 mL/min (by C-G formula based on SCr of 1.09 mg/dL). Liver Function Tests: Recent Labs  Lab 07/11/18 1724  AST 52*  ALT 63*  ALKPHOS 107  BILITOT 0.6  PROT 6.8  ALBUMIN 3.2*   Recent Labs  Lab 07/11/18 1724  LIPASE 32   No results for input(s): AMMONIA in the last 168 hours. Coagulation Profile: Recent Labs  Lab 07/16/18 0500 07/17/18 0452 07/17/18 0649  INR 1.37 1.45 1.42   Cardiac Enzymes: No results for input(s): CKTOTAL, CKMB, CKMBINDEX, TROPONINI in the last 168 hours. BNP (last 3 results) No results for input(s): PROBNP in the last 8760 hours. HbA1C: No results for input(s): HGBA1C in the  last 72 hours. CBG: No results for input(s): GLUCAP in the last 168 hours. Lipid Profile: No results for input(s): CHOL, HDL, LDLCALC, TRIG, CHOLHDL, LDLDIRECT in the last 72 hours. Thyroid Function Tests: No results for input(s): TSH, T4TOTAL, FREET4, T3FREE, THYROIDAB in the last 72 hours. Anemia Panel: No results for input(s): VITAMINB12, FOLATE, FERRITIN, TIBC, IRON, RETICCTPCT in the last 72 hours. Sepsis Labs: No results for input(s): PROCALCITON, LATICACIDVEN in the last 168 hours.  No results found for this or any previous visit (from the past 240 hour(s)).       Radiology Studies: No results found.      Scheduled Meds: . digoxin  0.125 mg Oral Daily  . losartan  25 mg Oral BID  . QUEtiapine  50 mg Oral QHS  . sodium chloride flush  3 mL Intravenous Once  . sodium chloride flush  3 mL Intravenous Q12H  . spironolactone  25 mg Oral QHS  . [START ON 07/18/2018] torsemide  40 mg Oral Daily  . warfarin  7.5 mg Oral ONCE-1800  .  Warfarin - Pharmacist Dosing Inpatient   Does not apply q1800   Continuous Infusions: . sodium chloride    . heparin 1,550 Units/hr (07/16/18 2054)     LOS: 6 days    Time spent: 25 mins.  Barnetta Chapel, MD [facility] Pager [phone]  If 7PM-7AM, please contact night-coverage [email or web address] Password [email or web address] 07/17/2018, 3:04 PM

## 2018-07-17 NOTE — Progress Notes (Signed)
ANTICOAGULATION CONSULT NOTE - Follow-Up Consult  Pharmacy Consult for heparin + warfarin Indication: LV Thrombus  No Known Allergies  Patient Measurements: Height: 5\' 8"  (172.7 cm) Weight: 155 lb 1.6 oz (70.4 kg) IBW/kg (Calculated) : 68.4 Heparin Dosing Weight: 70kg  Vital Signs: Temp: 97.7 F (36.5 C) (02/09 0430) Temp Source: Oral (02/09 0430) BP: 104/84 (02/09 0430) Pulse Rate: 100 (02/09 0430)  Labs: Recent Labs    07/15/18 0409  07/16/18 0500 07/16/18 1005 07/17/18 0452 07/17/18 0649 07/17/18 1128  HGB 16.2  --  15.0  --  16.8  --   --   HCT 50.7  --  48.2  --  53.6*  --   --   PLT 396  --  358  --  365  --   --   LABPROT  --   --  16.7*  --  17.5* 17.2*  --   INR  --   --  1.37  --  1.45 1.42  --   HEPARINUNFRC 2.20*   < >  --  0.56  --  >2.20* 0.64  CREATININE 1.27*  --  1.27*  --  1.09  --   --    < > = values in this interval not displayed.    Estimated Creatinine Clearance: 81.1 mL/min (by C-G formula based on SCr of 1.09 mg/dL).   Medical History: Past Medical History:  Diagnosis Date  . CHF (congestive heart failure) (HCC)   . Dental infection   . Enlarged heart   . Hernia, inguinal, right   . Hypertension      Assessment: 37 yoM with advanced HF found to have LV thrombus on ECHO. Pharmacy dosing heparin and coumadin (day 2 overlap) -heparin level= 0.64 (earlier value of > 2.2 was error), INR= 1.42  Goal of Therapy:  Heparin level 0.3-0.7 units/ml Monitor platelets by anticoagulation protocol: Yes   Plan:  -No heparin changes needed -Warfarin 7.5mg  PO x1 tonight -Daily heparin level, CBC, INR  Harland German, PharmD Clinical Pharmacist **Pharmacist phone directory can now be found on [email or web address] ([email or web address]).  Listed under [facility].

## 2018-07-18 DIAGNOSIS — F1410 Cocaine abuse, uncomplicated: Secondary | ICD-10-CM

## 2018-07-18 LAB — BASIC METABOLIC PANEL
Anion gap: 11 (ref 5–15)
BUN: 23 mg/dL — ABNORMAL HIGH (ref 6–20)
CO2: 22 mmol/L (ref 22–32)
Calcium: 9 mg/dL (ref 8.9–10.3)
Chloride: 99 mmol/L (ref 98–111)
Creatinine, Ser: 1.37 mg/dL — ABNORMAL HIGH (ref 0.61–1.24)
GFR calc Af Amer: >60 mL/min (ref >60)
GFR calc non Af Amer: >60 mL/min (ref >60)
GLUCOSE: 99 mg/dL (ref 70–99)
Potassium: 4.8 mmol/L (ref 3.5–5.1)
Sodium: 132 mmol/L — ABNORMAL LOW (ref 135–145)

## 2018-07-18 LAB — CBC
HEMATOCRIT: 51.7 % (ref 39.0–52.0)
Hemoglobin: 16.2 g/dL (ref 13.0–17.0)
MCH: 27.4 pg (ref 26.0–34.0)
MCHC: 31.3 g/dL (ref 30.0–36.0)
MCV: 87.3 fL (ref 80.0–100.0)
Platelets: 364 10*3/uL (ref 150–400)
RBC: 5.92 MIL/uL — ABNORMAL HIGH (ref 4.22–5.81)
RDW: 13.9 % (ref 11.5–15.5)
WBC: 10.3 10*3/uL (ref 4.0–10.5)
nRBC: 0 % (ref 0.0–0.2)

## 2018-07-18 LAB — COOXEMETRY PANEL
Carboxyhemoglobin: 1.1 % (ref 0.5–1.5)
Methemoglobin: 1.5 % (ref 0.0–1.5)
O2 Saturation: 68.2 %
Total hemoglobin: 16.3 g/dL — ABNORMAL HIGH (ref 12.0–16.0)

## 2018-07-18 LAB — HEPARIN LEVEL (UNFRACTIONATED): Heparin Unfractionated: 0.64 IU/mL (ref 0.30–0.70)

## 2018-07-18 LAB — MAGNESIUM: Magnesium: 2.2 mg/dL (ref 1.7–2.4)

## 2018-07-18 LAB — PROTIME-INR
INR: 1.71
Prothrombin Time: 19.9 seconds — ABNORMAL HIGH (ref 11.4–15.2)

## 2018-07-18 MED ORDER — WARFARIN SODIUM 7.5 MG PO TABS
7.5000 mg | ORAL_TABLET | Freq: Once | ORAL | Status: AC
  Administered 2018-07-18: 7.5 mg via ORAL
  Filled 2018-07-18: qty 1

## 2018-07-18 NOTE — Progress Notes (Signed)
PROGRESS NOTE    Paul Mccall  PYK:[id number] DOB: 1970-08-26 DOA: 07/11/2018 PCP: Patient, No Pcp Per   Brief Narrative: Patient is a 48 year old male with history of severe nonischemic cardiomyopathy with ejection fraction of 20%, cocaine abuse , noncompliance who presents to the emergency department with complaints of chest pain or shortness of breath.  Patient had a cardiac cath on 06/09/2018 with pain which showed an ejection fraction of less than 20%.  He just moved to [place].  He was also complaining of right upper quadrant pain.  Found to have elevated BNP on presentation.  Chest ray showed cardiomegaly.  Cardiology consulted for severe congestive heart failure.  07/15/2018: Patient seen.  No new complaints.  Cardiology (CHF team) managing.  No chest pain.  No shortness of breath.  07/16/2018: Patient seen.  Reports worsening of shortness of breath today.  Cardiology input appreciated.  IV Lasix 80 mg twice daily has been added to patient's congestive heart failure regimen.  Cardiology team is directing care.  07/17/2018: Patient seen.  No new complaints.  IV Lasix is on hold.  CHF team is managing patient's congestive heart failure.  Input is highly appreciated.  No new complaints.  07/18/2018: Patient seen.  Patient continues to report some shortness of breath.  Cardiology previously appreciated.  Likely discharge back on tomorrow.  INR is 1.7 today.  Assessment & Plan:   Active Problems:   Congestive heart failure (CHF) (HCC)   Cocaine abuse (HCC)   Acute on congestive heart failure: History of systolic heart failure thought to be secondary to nonischemic cardiomyopathy.  Ejection fraction of 20%  as per  cardiac cath on 2/20 at [facility].  Cardiology consulted and following.  Continued on IV Lasix.  Continue input/output monitoring.  Elevated  BNP on presentation. Also started on milrinone, spironolactone and digoxin.  Echocardiogram done here showed ejection fraction of 15 to 20%.,   Dilated cardiomyopathy, grade 3 diastolic dysfunction.  Severe hypokinesis Having good diuresis. Plan is for CRT if he gives up drug abuse and noncompliance. 07/15/2018: Patient seen.  No new complaints.  Cardiology (CHF team) managing.  No chest pain.  No shortness of breath. 07/16/2018: IV Lasix 80 mg twice daily added to the regimen.  Continue management as per cardiology team. 07/17/2018: IV Lasix is discontinued.  CHF team is managing care.   07/18/2018: Patient seen.  Patient continues to report some shortness of breath.  Cardiology previously appreciated.  Likely discharge back on tomorrow.  INR is 1.7 today.  Apical thrombus: 2.6cm mobile thrombus noted at the apical lateral myocardium asper Echo.  Started on heparin drip. Oral anticoagulation as per cardiology. 07/18/2018: INR is 1.7.  Hypertension: Currently blood pressure stable.  Chest pain/Right upper quadrant pain: Chest pain has resolved.  Abdomen ultrasound did not show any cholelithiasis or any signs of cholecystitis.  Still complains of right upper quadrant pain and there is RUQ tenderness.  This is most likely tender hepatomegaly from congestive heart failure. 07/15/2018: Resolved.  CKD stage II: Currently stable.  Monitor with diuresis.  Hypokalemia: Supplemented with potassium. 07/15/2018: Resolved. 03/28/2019: Potassium is 4.8.  Cocaine abuse/smoker:  Snorts/smokes cocaine.  Counseled for cessation.  Bipolar disorder/anxiety disorder:  Continue Klonopin for now.  He needs to follow-up with psychiatry as an outpatient after discharge. 07/15/2018: Stable.   DVT prophylaxis: Lovenox Code Status: Full Family Communication: Wife present at bedside Disposition Plan: Home after stabilization of CHF status   Consultants: Cardiology  Procedures: None  Antimicrobials:  Anti-infectives (  From admission, onward)   None      Subjective: No shortness of breath.   No chest pain.  Objective: Vitals:   07/17/18 1532  07/17/18 2037 07/18/18 0632 07/18/18 0927  BP: 102/76 104/80 107/76   Pulse: 90 85 98 96  Resp: 18 14 16    Temp: 97.7 F (36.5 C) (!) 97.4 F (36.3 C) 98 F (36.7 C)   TempSrc: Oral Oral Oral   SpO2: 99% 98% 100%   Weight:   70.3 kg   Height:        Intake/Output Summary (Last 24 hours) at 07/18/2018 1704 Last data filed at 07/18/2018 1522 Gross per 24 hour  Intake 309 ml  Output 750 ml  Net -441 ml   Filed Weights   07/15/18 0317 07/16/18 0551 07/18/18 8546  Weight: 68.8 kg 70.4 kg 70.3 kg    Examination:  General exam: Not in any obvious distress.  Awake and alert. HEENT:PERRL,Oral mucosa moist, Ear/Nose normal on gross exam.  No elevated JVD. Respiratory system: Decreased air entry globally.   Cardiovascular system: S1 & S2 heard Gastrointestinal system: Abdomen is nondistended, soft.  Right upper quadrant tenderness Central nervous system: Alert and oriented. No focal neurological deficits. Extremities: No leg edema.  Data Reviewed: I have personally reviewed following labs and imaging studies  CBC: Recent Labs  Lab 07/14/18 0522 07/15/18 0409 07/16/18 0500 07/17/18 0452 07/18/18 0500  WBC 11.3* 9.8 9.3 10.5 10.3  HGB 15.8 16.2 15.0 16.8 16.2  HCT 47.0 50.7 48.2 53.6* 51.7  MCV 85.5 85.8 88.4 89.6 87.3  PLT 378 396 358 365 364   Basic Metabolic Panel: Recent Labs  Lab 07/14/18 0522 07/15/18 0409 07/16/18 0500 07/17/18 0452 07/18/18 0500  NA 136 134* 136 132* 132*  K 3.7 4.1 4.6 4.6 4.8  CL 96* 98 105 99 99  CO2 26 26 23 23 22   GLUCOSE 103* 143* 108* 106* 99  BUN 14 14 13 18  23*  CREATININE 1.14 1.27* 1.27* 1.09 1.37*  CALCIUM 9.0 8.9 8.1* 8.9 9.0  MG 2.0 2.2 2.3 2.4 2.2   GFR: Estimated Creatinine Clearance: 64.5 mL/min (A) (by C-G formula based on SCr of 1.37 mg/dL (H)). Liver Function Tests: Recent Labs  Lab 07/11/18 1724  AST 52*  ALT 63*  ALKPHOS 107  BILITOT 0.6  PROT 6.8  ALBUMIN 3.2*   Recent Labs  Lab 07/11/18 1724    LIPASE 32   No results for input(s): AMMONIA in the last 168 hours. Coagulation Profile: Recent Labs  Lab 07/16/18 0500 07/17/18 0452 07/17/18 0649 07/18/18 0500  INR 1.37 1.45 1.42 1.71   Cardiac Enzymes: No results for input(s): CKTOTAL, CKMB, CKMBINDEX, TROPONINI in the last 168 hours. BNP (last 3 results) No results for input(s): PROBNP in the last 8760 hours. HbA1C: No results for input(s): HGBA1C in the last 72 hours. CBG: No results for input(s): GLUCAP in the last 168 hours. Lipid Profile: No results for input(s): CHOL, HDL, LDLCALC, TRIG, CHOLHDL, LDLDIRECT in the last 72 hours. Thyroid Function Tests: No results for input(s): TSH, T4TOTAL, FREET4, T3FREE, THYROIDAB in the last 72 hours. Anemia Panel: No results for input(s): VITAMINB12, FOLATE, FERRITIN, TIBC, IRON, RETICCTPCT in the last 72 hours. Sepsis Labs: No results for input(s): PROCALCITON, LATICACIDVEN in the last 168 hours.  No results found for this or any previous visit (from the past 240 hour(s)).       Radiology Studies: No results found.      Scheduled Meds: .  digoxin  0.125 mg Oral Daily  . losartan  25 mg Oral BID  . QUEtiapine  50 mg Oral QHS  . sodium chloride flush  3 mL Intravenous Once  . sodium chloride flush  3 mL Intravenous Q12H  . spironolactone  25 mg Oral QHS  . warfarin  7.5 mg Oral ONCE-1800  . Warfarin - Pharmacist Dosing Inpatient   Does not apply q1800   Continuous Infusions: . sodium chloride    . heparin 1,550 Units/hr (07/18/18 0734)     LOS: 7 days    Time spent: 25 mins.  Barnetta Chapel, MD [facility] Pager [phone]  If 7PM-7AM, please contact night-coverage [email or web address] Password [facility] 07/18/2018, 5:04 PM

## 2018-07-18 NOTE — Progress Notes (Signed)
ANTICOAGULATION CONSULT NOTE - Follow-Up Consult  Pharmacy Consult for heparin + warfarin Indication: LV Thrombus  No Known Allergies  Patient Measurements: Height: 5\' 8"  (172.7 cm) Weight: 155 lb (70.3 kg) IBW/kg (Calculated) : 68.4 Heparin Dosing Weight: 70kg  Vital Signs: Temp: 98 F (36.7 C) (02/10 0632) Temp Source: Oral (02/10 0488) BP: 107/76 (02/10 8916) Pulse Rate: 96 (02/10 0927)  Labs: Recent Labs    07/16/18 0500  07/17/18 0452 07/17/18 0649 07/17/18 1128 07/18/18 0500  HGB 15.0  --  16.8  --   --  16.2  HCT 48.2  --  53.6*  --   --  51.7  PLT 358  --  365  --   --  364  LABPROT 16.7*  --  17.5* 17.2*  --  19.9*  INR 1.37  --  1.45 1.42  --  1.71  HEPARINUNFRC  --    < >  --  >2.20* 0.64 0.64  CREATININE 1.27*  --  1.09  --   --  1.37*   < > = values in this interval not displayed.    Estimated Creatinine Clearance: 64.5 mL/min (A) (by C-G formula based on SCr of 1.37 mg/dL (H)).   Medical History: Past Medical History:  Diagnosis Date  . CHF (congestive heart failure) (HCC)   . Dental infection   . Enlarged heart   . Hernia, inguinal, right   . Hypertension      Assessment: 66 yoM with advanced HF found to have LV thrombus on ECHO. Pharmacy dosing heparin and coumadin (day 3 overlap) -heparin level= 0.64 on heparin drip rate 1550 uts/hr, INR= 1.7  CBC stable   Goal of Therapy:  INR 2-3 Heparin level 0.3-0.7 units/ml Monitor platelets by anticoagulation protocol: Yes   Plan:  Continue heparin drip 1550 uts/hr  -Warfarin 7.5mg  PO x1 tonight -Daily heparin level, CBC, INR   Leota Sauers Pharm.D. CPP, BCPS Clinical Pharmacist [phone] 07/18/2018 9:47 AM

## 2018-07-18 NOTE — Progress Notes (Addendum)
Patient ID: Paul Mccall, male   DOB: February 28, 1971, 48 y.o.   MRN: [id number]     Advanced Heart Failure Rounding Note  PCP-Cardiologist: No primary care provider on file.   Subjective:    Milrinone stopped yesterday. Coox 68%. Creatinine up 1.09 -> 1.37.  Received 80 mg IV lasix x1 yesterday. Sluggish diuresis with only net negative -900 mls. Weight unchanged. CVP 8-9. Received torsemide 40 mg x1 this am.   On warfarin/heparin bridge for LV thrombus. INR 1.71  Still SOB with moving around in room. Had an episode of back pain r/t mid chest last night, which resolved with IV morphine. None further.  Echo 07/14/18: EF 15-20%, dilated CM, anteroseptal, inferoseptal, and inferior akinesis. Otherwise severe global hypokinesis, grade 3 DD, 2.6 cm mobile thrombus at apical lateral myocardium, severely dilated LA, mildly dilated RA, moderate MR, mild calcification of aortic valve  Objective:   Weight Range: 70.3 kg Body mass index is 23.57 kg/m.   Vital Signs:   Temp:  [97.4 F (36.3 C)-98 F (36.7 C)] 98 F (36.7 C) (02/10 4098) Pulse Rate:  [85-98] 96 (02/10 0927) Resp:  [14-18] 16 (02/10 0632) BP: (102-107)/(76-80) 107/76 (02/10 0632) SpO2:  [98 %-100 %] 100 % (02/10 1191) Weight:  [70.3 kg] 70.3 kg (02/10 0632) Last BM Date: 07/17/18  Weight change: Filed Weights   07/15/18 0317 07/16/18 0551 07/18/18 4782  Weight: 68.8 kg 70.4 kg 70.3 kg    Intake/Output:   Intake/Output Summary (Last 24 hours) at 07/18/2018 0930 Last data filed at 07/18/2018 0700 Gross per 24 hour  Intake 891 ml  Output 1225 ml  Net -334 ml      Physical Exam    General: Appears fatigued. No resp difficulty. HEENT: Normal Neck: Supple. JVP 8-9. Carotids 2+ bilat; no bruits. No thyromegaly or nodule noted. Cor: PMI laterally displaced. RRR, 1/6 HSM apex.  Lungs: CTAB, normal effort. Abdomen: Soft, non-tender, non-distended, no HSM. No bruits or masses. +BS  Extremities: No cyanosis, clubbing, or  rash. R and LLE no edema.  Neuro: Alert & orientedx3, cranial nerves grossly intact. moves all 4 extremities w/o difficulty. Affect pleasant  Telemetry   NSR 90s with LBBB. Personally reviewed.   EKG    NSR 96 bpm with LBBB. Personally reviewed.   Labs    CBC Recent Labs    07/17/18 0452 07/18/18 0500  WBC 10.5 10.3  HGB 16.8 16.2  HCT 53.6* 51.7  MCV 89.6 87.3  PLT 365 364   Basic Metabolic Panel Recent Labs    95/62/13 0452 07/18/18 0500  NA 132* 132*  K 4.6 4.8  CL 99 99  CO2 23 22  GLUCOSE 106* 99  BUN 18 23*  CREATININE 1.09 1.37*  CALCIUM 8.9 9.0  MG 2.4 2.2   Liver Function Tests No results for input(s): AST, ALT, ALKPHOS, BILITOT, PROT, ALBUMIN in the last 72 hours. No results for input(s): LIPASE, AMYLASE in the last 72 hours. Cardiac Enzymes No results for input(s): CKTOTAL, CKMB, CKMBINDEX, TROPONINI in the last 72 hours.  BNP: BNP (last 3 results) Recent Labs    07/11/18 1411  BNP 1,099.3*    ProBNP (last 3 results) No results for input(s): PROBNP in the last 8760 hours.   D-Dimer No results for input(s): DDIMER in the last 72 hours. Hemoglobin A1C No results for input(s): HGBA1C in the last 72 hours. Fasting Lipid Panel No results for input(s): CHOL, HDL, LDLCALC, TRIG, CHOLHDL, LDLDIRECT in the last 72 hours. Thyroid  Function Tests No results for input(s): TSH, T4TOTAL, T3FREE, THYROIDAB in the last 72 hours.  Invalid input(s): FREET3  Other results:   Imaging    No results found.   Medications:     Scheduled Medications: . digoxin  0.125 mg Oral Daily  . losartan  25 mg Oral BID  . QUEtiapine  50 mg Oral QHS  . sodium chloride flush  3 mL Intravenous Once  . sodium chloride flush  3 mL Intravenous Q12H  . spironolactone  25 mg Oral QHS  . torsemide  40 mg Oral Daily  . Warfarin - Pharmacist Dosing Inpatient   Does not apply q1800    Infusions: . sodium chloride    . heparin 1,550 Units/hr (07/18/18 0734)     PRN Medications: sodium chloride, albuterol, clonazePAM, guaiFENesin-dextromethorphan, ondansetron **OR** ondansetron (ZOFRAN) IV, sodium chloride flush, sodium chloride flush    Patient Profile   Paul Mccall is a 48 y.o. male with a history of chronic systolic heart failure (EF30%) due to NICM, HTN, bipolar disorder, cocaine abuse, tobacco abuse, and medication noncompliance.   Admitted with SOB, CP, and abdominal pain.   Assessment/Plan   1. Acute on chronic systolic HF due to NICM from cocaine abuse: No ICD. LHC 2018 no significant coronary disease. Echo 09/2017: EF 30-35%. Echo 2/20: EF 15-20%. Recent RHC (06/09/18) with CO 4.1, CI 2.24. Echo 07/14/18: EF 15-20%, dilated CM, anteroseptal, inferoseptal, and inferior akinesis. Otherwise severe global hypokinesis, grade 3 DD, 2.6 cm mobile thrombus at apical lateral myocardium, severely dilated LA, mildly dilated RA, moderate MR, mild calcification of aortic valve. RV mild to moderate HK.  Co-ox 68%. Milrinone stopped yesterday. Creatinine 1.09 -> 1.37. CVP 8-9.  - Continue torsemide 40 mg daily (1st dose this am).  - Continue spiro 25 mg daily - Continue digoxin 0.125 mg daily - Continue losartan 25 mg bid.  - No BB with low output/cocaine abuse.  - He is not currently a home inotrope or advanced therapies candidate.  - Would eventually qualify for CRT-D if he is able to stop cocaine and be compliant with medications/follow up. No change.  2. LV thrombus: On heparin bridge to therapeutic warfarin. INR 1.71 - He will need to be set up with coumadin clinic prior to DC.   3. LBBB: New as of 06/2018. Has not had new ischemic work up. Troponin 0.03. Thought to be secondary to worsening cardiomyopathy when evaluated at [facility]. No change.  4. Abdominal pain: Likely due to low output. Abdominal US negative. Has liver congestion on exam. No change.  5. Cocaine abuse: Last use 2/1. Discussed abstinence. No change.  6. Tobacco use: Smokes 1-2  cigarettes/week currently. Encouraged cessation. No change.  7. Bipolar disorder: Started on Seroquel this admission, was not on meds at time of admission. Seroquel is $3 with goodRx at [facility]. - Needs psych/PCP followup. No change.  8. NSVT: Quiescent. No change.  9. Social issues Annice Pih, HF CSW to see him today.  - He has medications other than torsemide (has lasix) through HF fund.  - We talked about where he is planning on living (what city) again today. He is not sure yet.  10. AKI - Creatinine up 1.09 to 1.37 overnight. Likely is milrinone dependent, but he is not a home milrinone candidate.  - Volume trending back up, will continue PO diuresis for today - Follow BMET  He will need to stay for at least one more day. Will work on getting him  set up for coumadin clinic outpatient today. Annice Pih, HF CSW to see him today.   Length of Stay: 7  Alford Highland, NP  07/18/2018, 9:30 AM  Advanced Heart Failure Team Pager [phone] (M-F; 7a - 4p)  Please contact [facility] for night-coverage after hours (4p -7a ) and weekends on [email or web address]  Patient seen and examined with the above-signed Advanced Practice Provider and/or Housestaff. I personally reviewed laboratory data, imaging studies and relevant notes. I independently examined the patient and formulated the important aspects of the plan. I have edited the note to reflect any of my changes or salient points. I have personally discussed the plan with the patient and/or family.   Feeling better today. Remains off milrinone for 24 hours. Co-ox ok. Creatinine and volume up slightly. Will watch for at least another 24 hours to see if he can maintain off milrinone.   Arvilla Meres, MD  1:32 PM

## 2018-07-18 NOTE — Progress Notes (Signed)
CSW asked to follow up with patient regarding plan for d/c as he will be followed by the HF clinic post discharge. CSW met with patient who states he is living in a motel with his girlfriend. He reports that family members in [place] have been assisting with finances to cover the cost of the motel. He states he was previously living in a shelter/motel in [place] but due to high demand he no longer was able to gain shelter. He states no local family in [place], [place]. He plans to return to the motel in [place] post discharge.He reports he has medicaid pending and has an appointment Tuesday 2/11 morning at bedside to complete a Social Security  Disability application. He states he has his HF medications for discharge but states there are other medications that he may need assistance upon discharge. He and his girlfriend report no income and are in need of gas money to get home. CSW will provide a [employer] gift card to obtain gas and refer to HF CSW/Navigator to follow up in the morning with additional needs as identified. Patient grateful for assistance and will be followed through the HF clinic upon discharge. Raquel Sarna, [place], [place]

## 2018-07-19 ENCOUNTER — Encounter (HOSPITAL_COMMUNITY): Payer: Self-pay | Admitting: Licensed Clinical Social Worker

## 2018-07-19 ENCOUNTER — Telehealth (HOSPITAL_COMMUNITY): Payer: Self-pay | Admitting: Licensed Clinical Social Worker

## 2018-07-19 LAB — PROTIME-INR
INR: 2.68
Prothrombin Time: 28.1 seconds — ABNORMAL HIGH (ref 11.4–15.2)

## 2018-07-19 LAB — BASIC METABOLIC PANEL
Anion gap: 9 (ref 5–15)
BUN: 21 mg/dL — ABNORMAL HIGH (ref 6–20)
CO2: 25 mmol/L (ref 22–32)
CREATININE: 1 mg/dL (ref 0.61–1.24)
Calcium: 9.3 mg/dL (ref 8.9–10.3)
Chloride: 95 mmol/L — ABNORMAL LOW (ref 98–111)
GFR calc non Af Amer: >60 mL/min (ref >60)
Glucose, Bld: 90 mg/dL (ref 70–99)
Potassium: 5.2 mmol/L — ABNORMAL HIGH (ref 3.5–5.1)
Sodium: 129 mmol/L — ABNORMAL LOW (ref 135–145)

## 2018-07-19 LAB — COOXEMETRY PANEL
Carboxyhemoglobin: 1.2 % (ref 0.5–1.5)
Methemoglobin: 0.9 % (ref 0.0–1.5)
O2 Saturation: 64.3 %
Total hemoglobin: 17.1 g/dL — ABNORMAL HIGH (ref 12.0–16.0)

## 2018-07-19 LAB — HEPARIN LEVEL (UNFRACTIONATED): Heparin Unfractionated: 0.5 IU/mL (ref 0.30–0.70)

## 2018-07-19 LAB — MAGNESIUM: Magnesium: 2.4 mg/dL (ref 1.7–2.4)

## 2018-07-19 MED ORDER — DIGOXIN 125 MCG PO TABS: 0.1250 mg | ORAL_TABLET | Freq: Every day | ORAL | 0 refills | Status: DC

## 2018-07-19 MED ORDER — WARFARIN SODIUM 2.5 MG PO TABS: 2.5000 mg | ORAL_TABLET | Freq: Once | ORAL | 0 refills | Status: DC

## 2018-07-19 MED ORDER — TORSEMIDE 20 MG PO TABS: 40.0000 mg | ORAL_TABLET | Freq: Every day | ORAL | 5 refills | Status: DC

## 2018-07-19 MED ORDER — LOSARTAN POTASSIUM 25 MG PO TABS: 25.0000 mg | ORAL_TABLET | Freq: Two times a day (BID) | ORAL | 0 refills | Status: DC

## 2018-07-19 MED ORDER — TORSEMIDE 20 MG PO TABS
40.0000 mg | ORAL_TABLET | Freq: Every day | ORAL | Status: DC
  Administered 2018-07-19: 40 mg via ORAL
  Filled 2018-07-19: qty 2

## 2018-07-19 MED ORDER — WARFARIN SODIUM 1 MG PO TABS: 1.0000 mg | ORAL_TABLET | Freq: Once | ORAL | Status: DC

## 2018-07-19 MED ORDER — WARFARIN SODIUM 7.5 MG PO TABS: 7.5000 mg | ORAL_TABLET | Freq: Every day | ORAL | 0 refills | Status: DC

## 2018-07-19 MED ORDER — WARFARIN SODIUM 2.5 MG PO TABS: 2.5000 mg | ORAL_TABLET | Freq: Every day | ORAL | 0 refills | Status: DC

## 2018-07-19 MED ORDER — QUETIAPINE FUMARATE 50 MG PO TABS: 50.0000 mg | ORAL_TABLET | Freq: Every day | ORAL | 0 refills | Status: DC

## 2018-07-19 MED ORDER — SPIRONOLACTONE 25 MG PO TABS: 25.0000 mg | ORAL_TABLET | Freq: Every day | ORAL | 0 refills | Status: DC

## 2018-07-19 MED ORDER — WARFARIN SODIUM 1 MG PO TABS
1.0000 mg | ORAL_TABLET | Freq: Once | ORAL | Status: AC
  Administered 2018-07-19: 1 mg via ORAL
  Filled 2018-07-19: qty 1

## 2018-07-19 MED FILL — QUETIAPINE FUMARATE 50 MG T: 50 | 30 days supply | Qty: 30 | Fill #0

## 2018-07-19 MED FILL — TORSEMIDE 20 MG TABLET: 20 | 30 days supply | Qty: 60 | Fill #0

## 2018-07-19 NOTE — Progress Notes (Addendum)
Patient ID: Paul Mccall, male   DOB: [birth year], 48 y.o.   MRN: [id number]     Advanced Heart Failure Rounding Note  PCP-Cardiologist: No primary care provider on file.   Subjective:    Milrinone stopped 2/9. Coox 64%. Labs not yet drawn.   Started on torsemide 40 mg daily yesterday. Weight down 3 lbs. Sluggish UOP charted.   On warfarin/heparin bridge for LV thrombus. INR pending.  Denies CP or SOB. Feeling a lot better. Urinated a lot, but had to dump some of his own urine.   Echo 07/14/18: EF 15-20%, dilated CM, anteroseptal, inferoseptal, and inferior akinesis. Otherwise severe global hypokinesis, grade 3 DD, 2.6 cm mobile thrombus at apical lateral myocardium, severely dilated LA, mildly dilated RA, moderate MR, mild calcification of aortic valve  Objective:   Weight Range: 69.3 kg Body mass index is 23.23 kg/m.   Vital Signs:   Temp:  [97.5 F (36.4 C)] 97.5 F (36.4 C) (02/11 0333) Pulse Rate:  [96] 96 (02/11 0333) Resp:  [16] 16 (02/11 0333) BP: (108-109)/(80-83) 108/80 (02/11 0333) SpO2:  [96 %-100 %] 100 % (02/11 0333) Weight:  [69.3 kg] 69.3 kg (02/11 0329) Last BM Date: 07/18/18  Weight change: Filed Weights   07/16/18 0551 07/18/18 0632 07/19/18 0329  Weight: 70.4 kg 70.3 kg 69.3 kg    Intake/Output:   Intake/Output Summary (Last 24 hours) at 07/19/2018 0754 Last data filed at 07/19/2018 0550 Gross per 24 hour  Intake 935.76 ml  Output 900 ml  Net 35.76 ml      Physical Exam    General: Appears fatigued. No resp difficulty. HEENT: Normal Neck: Supple. JVP ~7. Carotids 2+ bilat; no bruits. No thyromegaly or nodule noted. Cor: PMI nondisplaced. RRR, 1/6 HSM apex Lungs: CTAB, normal effort. Abdomen: Soft, non-tender, non-distended, no HSM. No bruits or masses. +BS  Extremities: No cyanosis, clubbing, or rash. R and LLE no edema.  Neuro: Alert & orientedx3, cranial nerves grossly intact. moves all 4 extremities w/o difficulty. Affect  pleasant  Telemetry   NSR 90s with LBBB. Personally reviewed.   EKG    NSR 96 bpm with LBBB. Personally reviewed.   Labs    CBC Recent Labs    07/17/18 0452 07/18/18 0500  WBC 10.5 10.3  HGB 16.8 16.2  HCT 53.6* 51.7  MCV 89.6 87.3  PLT 365 364   Basic Metabolic Panel Recent Labs    34/19/62 0452 07/18/18 0500  NA 132* 132*  K 4.6 4.8  CL 99 99  CO2 23 22  GLUCOSE 106* 99  BUN 18 23*  CREATININE 1.09 1.37*  CALCIUM 8.9 9.0  MG 2.4 2.2   Liver Function Tests No results for input(s): AST, ALT, ALKPHOS, BILITOT, PROT, ALBUMIN in the last 72 hours. No results for input(s): LIPASE, AMYLASE in the last 72 hours. Cardiac Enzymes No results for input(s): CKTOTAL, CKMB, CKMBINDEX, TROPONINI in the last 72 hours.  BNP: BNP (last 3 results) Recent Labs    07/11/18 1411  BNP 1,099.3*    ProBNP (last 3 results) No results for input(s): PROBNP in the last 8760 hours.   D-Dimer No results for input(s): DDIMER in the last 72 hours. Hemoglobin A1C No results for input(s): HGBA1C in the last 72 hours. Fasting Lipid Panel No results for input(s): CHOL, HDL, LDLCALC, TRIG, CHOLHDL, LDLDIRECT in the last 72 hours. Thyroid Function Tests No results for input(s): TSH, T4TOTAL, T3FREE, THYROIDAB in the last 72 hours.  Invalid input(s): FREET3  Other  results:   Imaging    No results found.   Medications:     Scheduled Medications: . digoxin  0.125 mg Oral Daily  . losartan  25 mg Oral BID  . QUEtiapine  50 mg Oral QHS  . sodium chloride flush  3 mL Intravenous Once  . sodium chloride flush  3 mL Intravenous Q12H  . spironolactone  25 mg Oral QHS  . Warfarin - Pharmacist Dosing Inpatient   Does not apply q1800    Infusions: . sodium chloride    . heparin 1,550 Units/hr (07/19/18 0010)    PRN Medications: sodium chloride, albuterol, clonazePAM, guaiFENesin-dextromethorphan, ondansetron **OR** ondansetron (ZOFRAN) IV, sodium chloride flush, sodium  chloride flush    Patient Profile   Paul Mccall is a 48 y.o. male with a history of chronic systolic heart failure (EF30%) due to NICM, HTN, bipolar disorder, cocaine abuse, tobacco abuse, and medication noncompliance.   Admitted with SOB, CP, and abdominal pain.   Assessment/Plan   1. Acute on chronic systolic HF due to NICM from cocaine abuse: No ICD. LHC 2018 no significant coronary disease. Echo 09/2017: EF 30-35%. Echo 2/20: EF 15-20%. Recent RHC (06/09/18) with CO 4.1, CI 2.24. Echo 07/14/18: EF 15-20%, dilated CM, anteroseptal, inferoseptal, and inferior akinesis. Otherwise severe global hypokinesis, grade 3 DD, 2.6 cm mobile thrombus at apical lateral myocardium, severely dilated LA, mildly dilated RA, moderate MR, mild calcification of aortic valve. RV mild to moderate HK.  Co-ox 64%. Milrinone stopped yesterday. Creatinine 1.09 -> 1.37 -> pending. CVP 10 - Continue torsemide 40 mg daily - Continue spiro 25 mg daily - Continue digoxin 0.125 mg daily - Continue losartan 25 mg bid.  - No BB with low output/cocaine abuse.  - He is not currently a home inotrope or advanced therapies candidate.  - Would eventually qualify for CRT-D if he is able to stop cocaine and be compliant with medications/follow up. No change.  2. LV thrombus: On heparin bridge to therapeutic warfarin. INR pending. - He has been set up in coumadin clinic. First appointment is Friday. He will be self-pay.  3. LBBB: New as of 06/2018. Has not had new ischemic work up. Troponin 0.03. Thought to be secondary to worsening cardiomyopathy when evaluated at [facility]. No change.  4. Abdominal pain: Likely due to low output. Abdominal US negative. Has liver congestion on exam. Resolved.  5. Cocaine abuse: Last use 2/1. Discussed abstinence. No change.  6. Tobacco use: Smokes 1-2 cigarettes/week currently. Encouraged cessation. No change.  7. Bipolar disorder: Started on Seroquel this admission, was not on meds at time of admission.  Will provide seroquel through HF fund.  - Needs psych/PCP followup. No change.  8. NSVT: Quiescent. No change.  9. Social issues Annice Pih, HF CSW saw him yesterday. Gave him $53 [employer] gift card for gas.  - He has medications other than torsemide (has lasix) through HF fund. Will get torsemide for him today.  - We talked about where he is planning on living (what city) again today. He plans to stick around [place] for a while.  10. AKI - Creatinine up 1.09 to 1.37 yesterday. Likely is milrinone dependent, but he is not a home milrinone candidate.  - BMET pending today.  Labs pending. DC PICC line once labs drawn. Will provide the rest of medications through HF fund. Will confirm with primary team.   Addendum: Torsemide and seroquel sent to outpatient pharmacy. Will pick up and pay through HF fund. Will deliver  medications, [employer] gift card, and scale prior to DC.   Length of Stay: 8  Alford Highland, NP  07/19/2018, 7:54 AM  Advanced Heart Failure Team Pager [phone] (M-F; 7a - 4p)  Please contact [facility] for night-coverage after hours (4p -7a ) and weekends on [email or web address]  Patient seen and examined with the above-signed Advanced Practice Provider and/or Housestaff. I personally reviewed laboratory data, imaging studies and relevant notes. I independently examined the patient and formulated the important aspects of the plan. I have edited the note to reflect any of my changes or salient points. I have personally discussed the plan with the patient and/or family.  Stable today off milrinone. Ok for d/c home on above medications. (will provide through HF pharmacy). We will provide seroquel but will not supply benzos through the HF program. We will signoff.   Arvilla Meres, MD  9:00 AM

## 2018-07-19 NOTE — Plan of Care (Signed)
  Problem: Activity: Goal: Risk for activity intolerance will decrease Outcome: Completed/Met  Pt is independently ambulatory in room. Pt denies CP/SOB/Dizziness with ambulation Problem: Nutrition: Goal: Adequate nutrition will be maintained Outcome: Completed/Met  Pt consuming 75-100% of all meals in addition to frequent requests for in between meal snacks

## 2018-07-19 NOTE — Discharge Summary (Signed)
Physician Discharge Summary  Patient ID: Paul Mccall MRN: [id number] DOB/AGE: Jun 05, 1971 48 y.o.  Admit date: 07/11/2018 Discharge date: 07/19/2018  Admission Diagnoses:  Discharge Diagnoses:  Active Problems:   Congestive heart failure (CHF) (HCC)   Cocaine abuse (HCC)  Discharged Condition: stable  Hospital Course: Patient is a 48 year old male, with past medical history significant for severe nonischemic cardiomyopathy with ejection fraction of 20%, cocaine abuse and noncompliance.  Patient presented with chest pain and shortness of breath.  Patient had a cardiac cath on 06/09/2018 which showed an ejection fraction of less than 20%.  Patient  just moved to [place].  On presentation, cardiac BNP was elevated.  Chest ray showed cardiomegaly.  Advanced heart failure (Cardiology) team was consulted for management of severe congestive heart failure.  Advanced heart failure team directed patient's care.  Patient was initially on milrinone, but was eventually weaned off of milrinone.  Patient was started on IV Lasix, and eventually transitioned to oral torsemide prior to discharge.  Echocardiogram done on 07/13/2018 revealed EF of 15 to 20%, dilated cardiomyopathy, echo evidence of restrictive diastolic relaxation, elevated left ventricular end-diastolic pressure.  Echocardiogram also revealed anteroseptal, inferoseptal, and the inferior akinesis mild otherwise, severe global hypokinesis.  Grade 3 diastolic dysfunction was reported.  2.6 cm mobile thrombus was noted at the apical lateral myocardium.  Left atrium was severely dilated.  Patient will be discharged to the care of the primary care provider and the advanced heart failure team.  Patient is currently on anticoagulation.  INR prior to discharge was 2.68.  Acute on congestive heart failure:  - History of systolic heart failure thought to be secondary to nonischemic cardiomyopathy.  Ejection fraction of 20%  as per  cardiac cath on 2/20 at [facility].    - Cardiology consulted, and advanced heart failure team directed care.  Kindly see above documentation.  - Plan is for CRT if he gives up drug abuse, and becomes compliant with medications.   Apical thrombus:  -2.6cm mobile thrombus noted at the apical lateral myocardium asper Echo.   -Started on heparin drip to bridge oral anticoagulation with Coumadin. -INR today is 2.68. -Continue to monitor INR and adjust Coumadin dose accordingly.  Hypertension: Stable. Continue to optimize.  Chest pain/Right upper quadrant pain: -Chest pain has resolved.   -Abdomen ultrasound did not show any cholelithiasis or any signs of cholecystitis.   -Possibly from congestive hepatopathy.    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 Possibly cardiorenal.  Hypokalemia:  Resolved.   Continue to monitor and replete.    Cocaine abuse/smoker:  Snorts/smokes cocaine.  Counseled.  Bipolar disorder/anxiety disorder: Follow-up with primary care provider and psychiatry for further care. Stable for now.  Consults: cardiology (advanced heart failure team)  Significant Diagnostic Studies:  See echocardiogram report as documented above.  Discharge Exam: Blood pressure 108/80, pulse 96, temperature (!) 97.5 F (36.4 C), temperature source Oral, resp. rate 16, height 5\' 8"  (1.727 m), weight 69.3 kg, SpO2 100 %.  Disposition: Discharge disposition: 01-Home or Self Care   Discharge Instructions    Diet - low sodium heart healthy   Complete by:  As directed    Increase activity slowly   Complete by:  As directed      Allergies as of 07/19/2018   No Known Allergies     Medication List    STOP taking these medications   furosemide 40 MG tablet Commonly known as:  LASIX   lisinopril 5 MG tablet Commonly known  as:  PRINIVIL,ZESTRIL   ondansetron 4 MG tablet Commonly known as:  ZOFRAN     TAKE these medications   digoxin 0.125 MG tablet Commonly known as:  LANOXIN Take 1 tablet  (0.125 mg total) by mouth daily.   losartan 25 MG tablet Commonly known as:  COZAAR Take 1 tablet (25 mg total) by mouth 2 (two) times daily.   QUEtiapine 50 MG tablet Commonly known as:  SEROQUEL Take 1 tablet (50 mg total) by mouth at bedtime.   spironolactone 25 MG tablet Commonly known as:  ALDACTONE Take 1 tablet (25 mg total) by mouth at bedtime.   torsemide 20 MG tablet Commonly known as:  DEMADEX Take 2 tablets (40 mg total) by mouth daily.   warfarin 2.5 MG tablet Commonly known as:  COUMADIN Take 1 tablet (2.5 mg total) by mouth daily for 30 doses.      Follow-up Information    [facility]. Go on 07/28/2018.   Specialty:  Cardiology Why:  at 2:00 pm in the Advanced Heart Failure Clinic.  Please bring all medications to appt.  Gate code is [phone] for February. Contact information: [address] [id number] [place] [address] [phone]       [facility] Follow up on 07/22/2018.   Specialty:  Cardiology Why:  Coumadin Clinic. 2:30 pm.  Contact information: [address], [address] [place] [address] [phone]       Claiborne Rigg, NP Follow up on 08/17/2018.   Specialty:  Nurse Practitioner Why:  Hospital Follow Up Appointment at 9:30 am. If you can not make this scheduled appointment pleas call to reschedule at eraliest convenience. Patient can use onsite pharmacy and cost ranges from $4.00-$10.00.  Contact information: [address] [place] [address] [phone]           Signed: Barnetta Chapel 07/19/2018, 1:17 PM

## 2018-07-19 NOTE — Care Management Note (Addendum)
Case Management Note  Patient Details  Name: Paul Mccall MRN: [id number] Date of Birth: [birth year]  Subjective/Objective:  Pt presented for SOB and cough. CHF- being followed by the Heart Failure Clinic. PTA pt states he and girlfriend are staying in a motel. Per girlfriend- Medicaid is pending. Pt is without PCP @ this time. Pt is agreeable to having CM schedule hospital follow up appointment at local clinic. Heart Failure Team will supply patient with medications for transition home and will follow up post transition.                   Action/Plan: CM did call the [facility] for hospital follow up. Appointment scheduled and placed on AVS. Patient will be able to utilize the pharmacy onsite and medications will cost $4.00-$10.00. No further needs from CM at this time.   Expected Discharge Date:                  Expected Discharge Plan:  Home/Self Care  In-House Referral:  Clinical Social Work  Discharge planning Services  CM Consult, Medication Assistance, HF Clinic, Follow-up appt scheduled, Indigent Health Clinic  Post Acute Care Choice:  NA Choice offered to:  Patient  DME Arranged:  N/A DME Agency:  NA  HH Arranged:  NA HH Agency:  NA  Status of Service:  Completed, signed off  If discussed at Long Length of Stay Meetings, dates discussed:    Additional Comments:  Gala Lewandowsky, RN 07/19/2018, 10:55 AM

## 2018-07-19 NOTE — Telephone Encounter (Signed)
Entered in error

## 2018-07-19 NOTE — Progress Notes (Signed)
Paramedicine Initial Assessment: [facility] CSW met with pt to explain outpatient paramedicine program and to completed initial assessment.   Housing:  In what kind of housing do you live? House/apt/trailer/shelter?  Pt has been moving from one long stay hotel to another.  Do you rent/pay a mortgage/own? Doesn't pay.  Do you live with anyone? Shaune Pollack.  Are you currently worried about losing your housing? Does not have stable housing- worried about what the next steps will be.  Within the past 12 months have you ever stayed outside, in a car, tent, a shelter, or temporarily with someone? yes  Within the past 12 months have you been unable to get utilities when it was really needed? no  Social:  What is your current marital status? Engaged  Do you have any children? 2 sons  Do you have family or friends who live locally? 2 sons, sister, aunt  Food:  Within the past 12 months were you ever worried that food would run out before you got money to buy more? yes  Within the past 52month have you run out of food and didn't have money to buy more? Yes  Income:  What is your current source of income? Family donations.  Is going to apply through [facility] for disability- had appt for today but is discharging and can't wait that far into the afternoon or will not have housing for the evening.  How hard is it for you to pay for the basics like food housing, medical care, and utilities? Very hard  Do you have outstanding medical bills? yes  Insurance:  Are you currently insured? no  Do you have prescription coverage? no  If no insurance, have you applied for coverage (Medicaid, disability, marketplace etc)? Yes, applied during current hospital stay.  Transportation:  Do you have transportation to your medical appointments? Yes   If yes, how? Owns a car but gas money is hard to come by.  In the past 12 months has lack of transportation kept you from  medical appts or from getting medications? Yes  In the past 12 months has lack of transportation kept you from meetings, work, or getting things you needed? Yes   Daily Health Needs: Do you have a working scale at home? No- provided one from clinic  How do you manage your medications at home?  Often doesn't get medications- [facility] [place] has not had medications recently.  Do you ever take your medications differently than prescribed? Yes  Do you have issues affording your medications? Yes  If yes, has this ever prevented you from obtaining medications? Yes  Do you have any concerns with mobility at home? no  Do you use any assistive devices at home or have PCS at home? no   Are there any additional barriers you see to getting the care you need?  Drug abuse.  Pt has been struggling with drug abuse with some time- has had periods of sobriety- recently was sober for a year but relapsed when he came into too much money and was tempted to use again.  Pt fiance also drug abuser (they met during NA meeting) and they are hopeful to get help in quitting again.  Pt had good success with NA meetings and would be agreeable to those options again- pt also agreeable to [facility] to see if he might be eligible for their program which is  oriented towards those with substance abuse and mental health issues.  Pt also expresses concerns about self limiting himself- states he has had medical set backs in the past and once he started feeling better he fell off his plan- pt cannot pinpoint why this is but is hopeful that paramedicine program will help him to be accountable to himself.  CSW will continue to follow through paramedicine program and assist as needed.  Jorge Ny, LCSW Clinical Social Worker [facility] [phone]

## 2018-07-19 NOTE — Progress Notes (Signed)
ANTICOAGULATION CONSULT NOTE - Follow-Up Consult  Pharmacy Consult for heparin + warfarin Indication: LV Thrombus  No Known Allergies  Patient Measurements: Height: 5\' 8"  (172.7 cm) Weight: 152 lb 12.8 oz (69.3 kg) IBW/kg (Calculated) : 68.4 Heparin Dosing Weight: 70kg  Vital Signs: Temp: 97.5 F (36.4 C) (02/11 0333) Temp Source: Oral (02/11 0333) BP: 108/80 (02/11 0333) Pulse Rate: 96 (02/11 0333)  Labs: Recent Labs    07/17/18 0452 07/17/18 0649 07/17/18 1128 07/18/18 0500  HGB 16.8  --   --  16.2  HCT 53.6*  --   --  51.7  PLT 365  --   --  364  LABPROT 17.5* 17.2*  --  19.9*  INR 1.45 1.42  --  1.71  HEPARINUNFRC  --  >2.20* 0.64 0.64  CREATININE 1.09  --   --  1.37*    Estimated Creatinine Clearance: 64.5 mL/min (A) (by C-G formula based on SCr of 1.37 mg/dL (H)).   Medical History: Past Medical History:  Diagnosis Date  . CHF (congestive heart failure) (HCC)   . Dental infection   . Enlarged heart   . Hernia, inguinal, right   . Hypertension      Assessment: 37 yoM with advanced HF found to have LV thrombus on ECHO. Pharmacy dosing heparin and coumadin (day 5 overlap).   Heparin level still not collected from this AM. INR today is 2.68 (increased from 1.71 yesterday). No s/sx of bleeding. No infusion issues.  Goal of Therapy:  INR 2-3 Heparin level 0.3-0.7 units/ml Monitor platelets by anticoagulation protocol: Yes   Plan:  -Discontinue heparin drip at 1550 uts/hr  -Warfarin 1 mg PO x1 today prior to discharge given INR increase overnight -At discharge: warfarin 2.5 mg starting 2/12 until INR check Thurs/Fri -Daily heparin level, CBC, INR  Sherron Monday, PharmD, BCCCP Clinical Pharmacist  Pager: [phone] Phone: [phone] 07/19/2018 8:50 AM

## 2018-07-26 ENCOUNTER — Telehealth (HOSPITAL_COMMUNITY): Payer: Self-pay | Admitting: Licensed Clinical Social Worker

## 2018-07-26 ENCOUNTER — Telehealth (HOSPITAL_COMMUNITY): Payer: Self-pay

## 2018-07-26 NOTE — Telephone Encounter (Signed)
CSW attempted to call pt several times to ensure he is aware of upcoming appointment on Thursday and that there are no barriers to him coming to appointment- unable to leave voicemail.  CSW contacted Dartha Lodge who is listed as pt emergency contact- this is pt ex-wife- she states she will pass the message along to patient if she can get a hold of him.  CSW will continue to follow and assist as needed.  Burna Sis, LCSW Clinical Social Worker [facility] [phone]

## 2018-07-26 NOTE — Telephone Encounter (Signed)
I called pt to schedule a initial CHP visit. No answer on cell phone number provided and the vm is full.  I will try to f/u with him tomorrow. He does have an [facility] appt this week.

## 2018-07-28 ENCOUNTER — Inpatient Hospital Stay (HOSPITAL_COMMUNITY): Payer: Self-pay

## 2018-07-28 ENCOUNTER — Other Ambulatory Visit (HOSPITAL_COMMUNITY): Payer: Self-pay

## 2018-07-28 ENCOUNTER — Telehealth (HOSPITAL_COMMUNITY): Payer: Self-pay

## 2018-07-28 NOTE — Telephone Encounter (Signed)
I called to remind Paul Mccall of his upcoming appointment with the [facility] team.  There was no answer on his cell and the mailbox is full.

## 2018-07-29 NOTE — Progress Notes (Signed)
Paramedicine Encounter   Patient ID: Paul Mccall , male,   DOB: [birth year],47 y.o.,  MRN: [id number]  NO SHOW  Met patient in clinic today with provider.  Time spent with patient   [place], [place] 07/29/2018   ACTION: Home visit completed

## 2018-08-10 ENCOUNTER — Encounter (HOSPITAL_COMMUNITY): Payer: Self-pay

## 2018-08-10 NOTE — Progress Notes (Signed)
After several unsuccessful attempts to reach Paul Mccall, I think that it's appropriate to discharge him from the community health paramedicine program.

## 2018-08-17 ENCOUNTER — Inpatient Hospital Stay: Payer: Self-pay | Admitting: Nurse Practitioner

## 2018-10-23 IMAGING — CT CT ABD-PELV W/ CM
2 of 5 series · 15 of 46 positions shown, 17 images · IV contrast (APPLIED)
Comparison: None.

CLINICAL DATA: Left-sided abdominal pain since this morning.

EXAM:
CT ABDOMEN AND PELVIS WITH CONTRAST
TECHNIQUE: Multidetector CT imaging of the abdomen and pelvis was performed
using the standard protocol following bolus administration of
intravenous contrast.
CONTRAST:  100mL LDSSMS-8JJ IOPAMIDOL (LDSSMS-8JJ) INJECTION 61%

[Series 2: axial st · axial · 0.76mm/px · z∈[-1124,-704]mm · 12 of 98 slices shown, 14 images]
[im 7/98  soft-tissue]
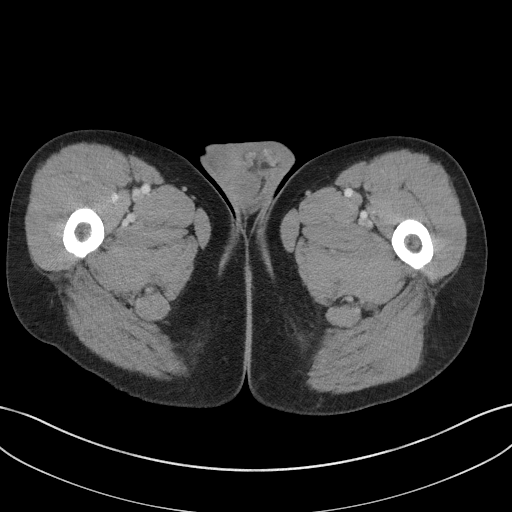
[im 7/98  bone]
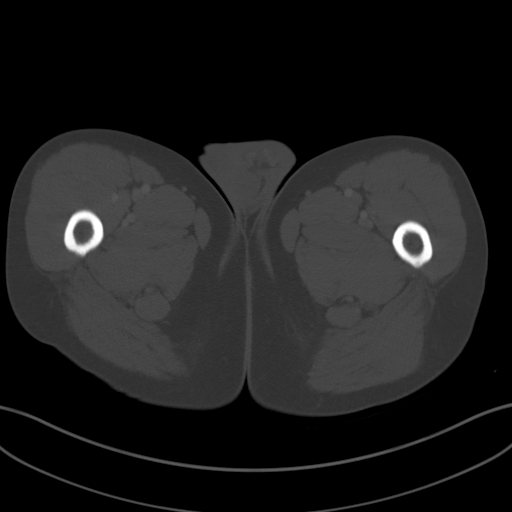
[im 14/98  soft-tissue]
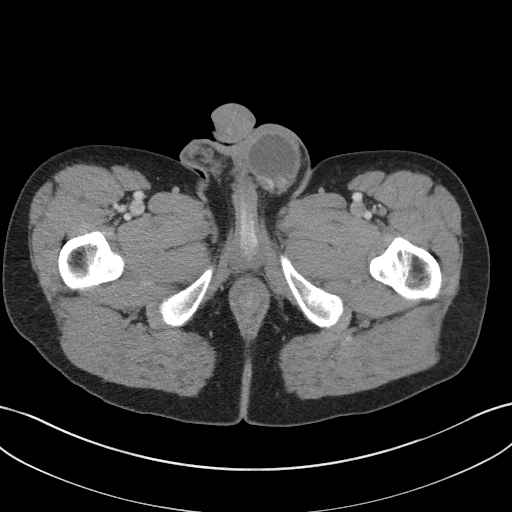
[im 21/98  soft-tissue]
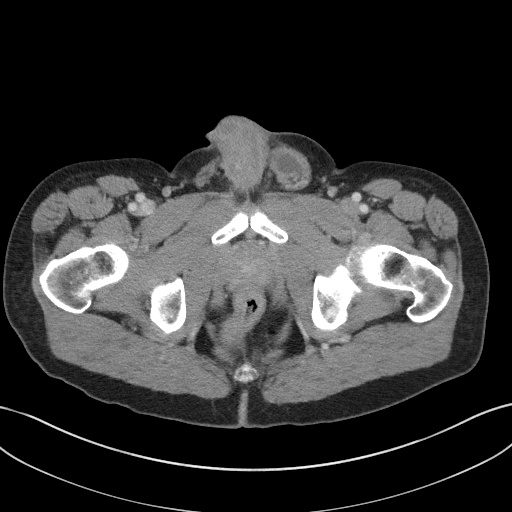
[im 28/98  soft-tissue]
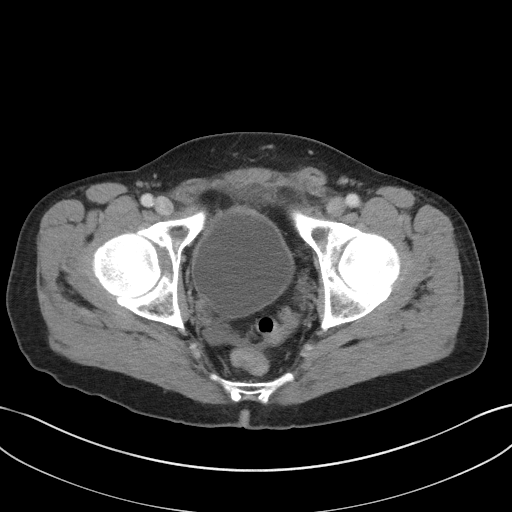
[im 35/98  soft-tissue]
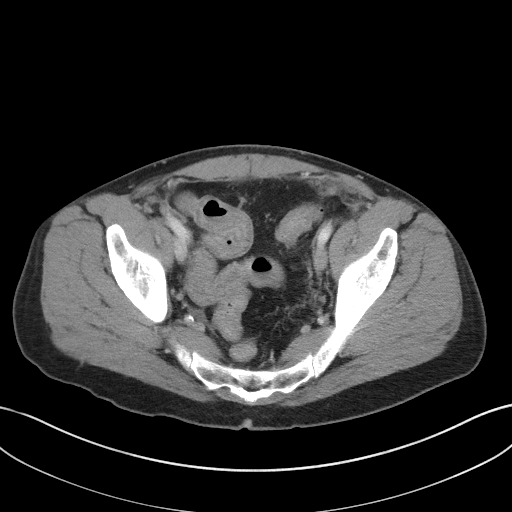
[im 42/98  soft-tissue]
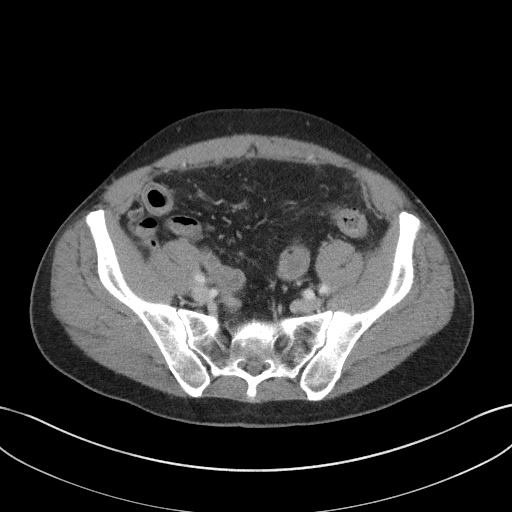
[im 56/98  soft-tissue]
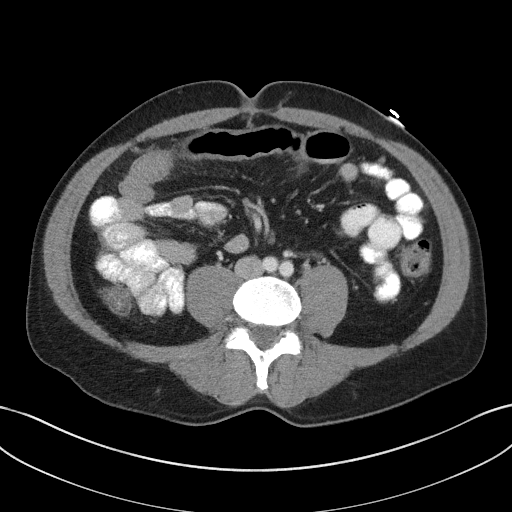
[im 63/98  soft-tissue]
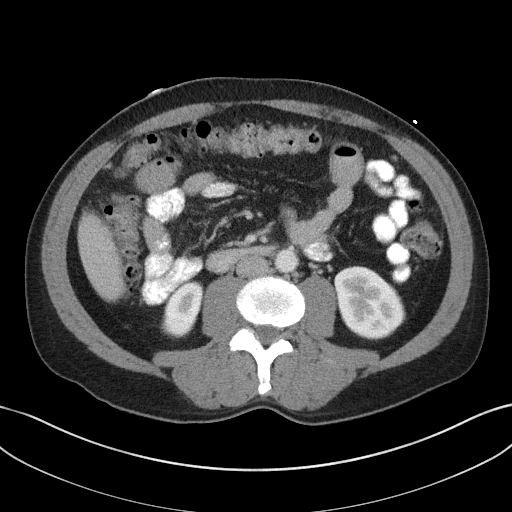
[im 70/98  soft-tissue]
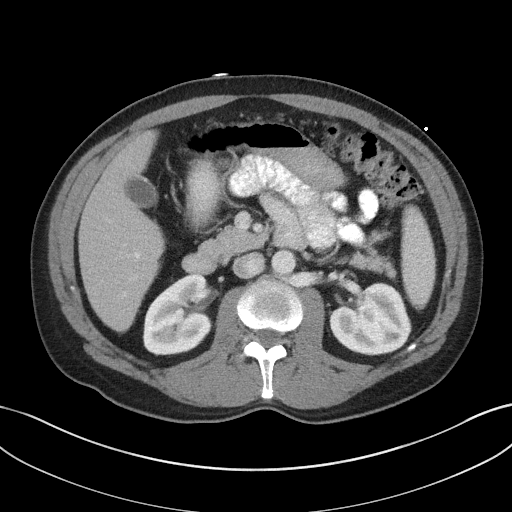
[im 70/98  bone]
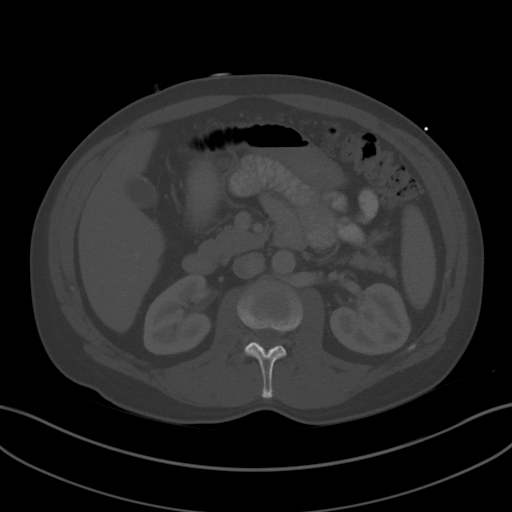
[im 77/98  soft-tissue]
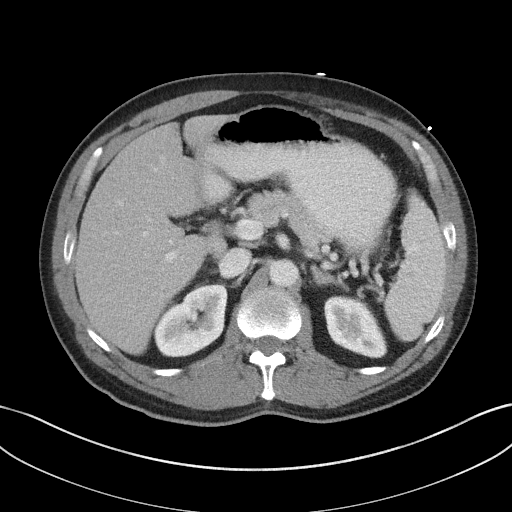
[im 84/98  soft-tissue]
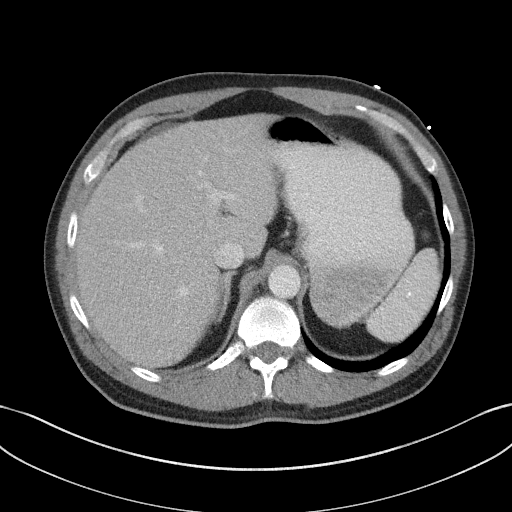
[im 91/98  soft-tissue]
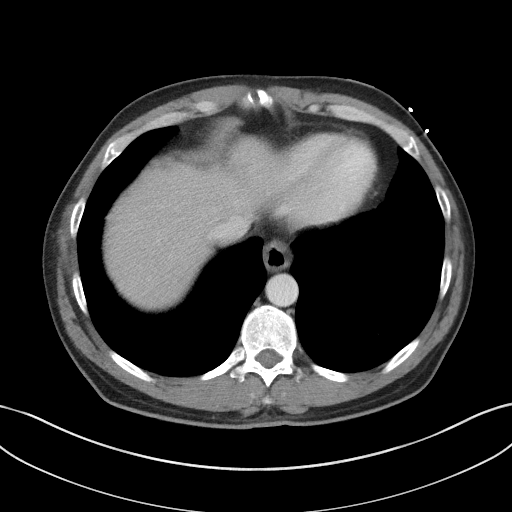

[Series 5: coronal st · coronal · 0.71mm/px · 3 of 88 slices shown]
[im 30/88  soft-tissue]
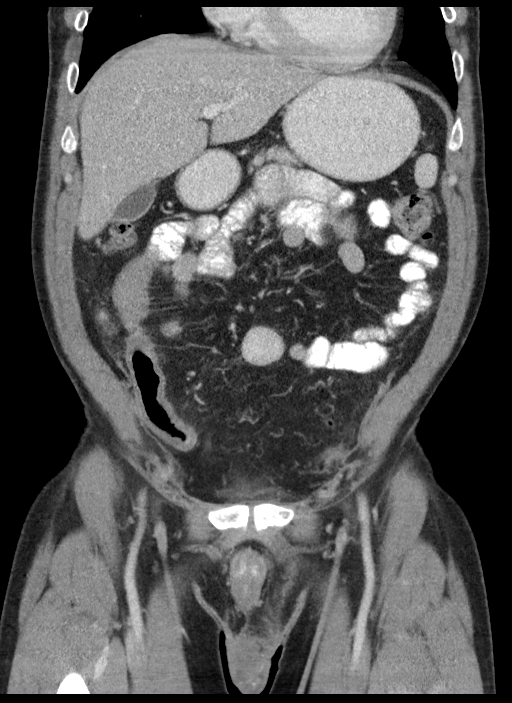
[im 39/88  soft-tissue]
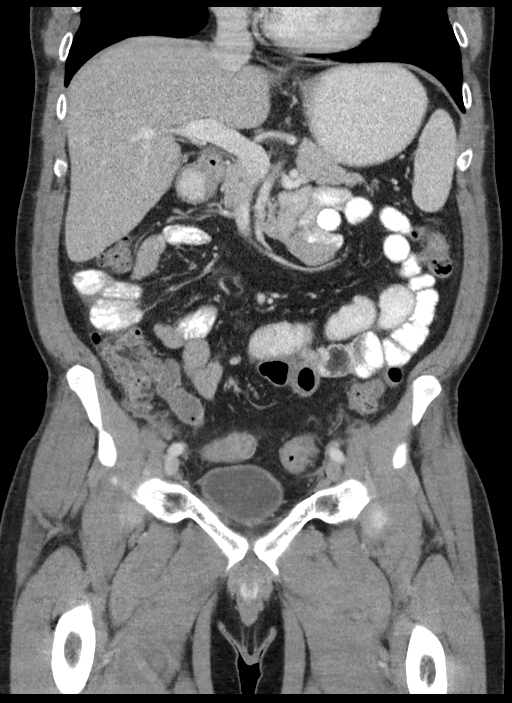
[im 49/88  soft-tissue]
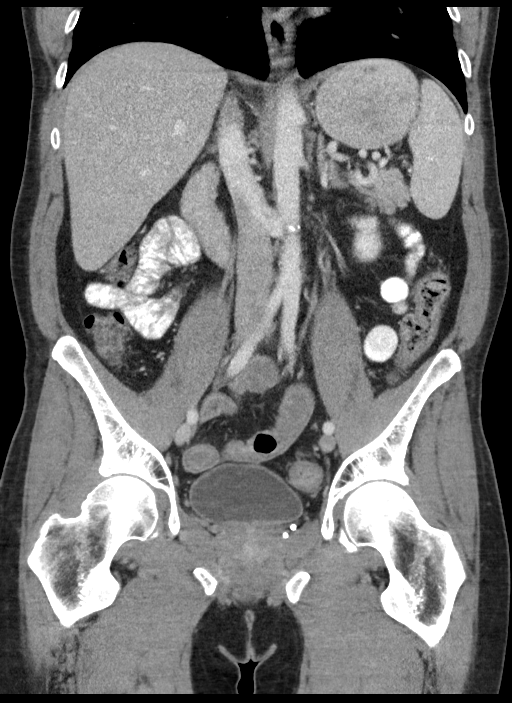

[15 of 46 positions shown; findings below may reference images not displayed]

FINDINGS: Lower chest: The lung bases are clear of acute process. No pleural
effusion or pulmonary lesions. The heart is normal in size. No
pericardial effusion. The distal esophagus and aorta are
unremarkable. Benign-appearing epicardial cyst on the right side.

Hepatobiliary: No focal hepatic lesions or intrahepatic biliary
dilatation. Gallbladder is grossly normal. No common bile duct
dilatation.

Pancreas: No mass, inflammation or ductal dilatation.

Spleen: Normal size.  No focal lesions.

Adrenals/Urinary Tract: The adrenal glands and kidneys are
unremarkable. No bladder abnormalities.

Stomach/Bowel: The stomach and duodenum are unremarkable. There are
slightly dilated mid distal small bowel loops with wall thickening
and submucosal edema along with perienteric inflammatory changes.
Findings consistent with an inflammatory or infectious enteritis.
The distal and terminal ileum are normal. The appendix is normal.
The colon is unremarkable.

Vascular/Lymphatic: The aorta is normal in caliber. No dissection.
The branch vessels are patent. The major venous structures are
patent. Retroaortic left renal vein. No mesenteric or
retroperitoneal mass or adenopathy. Small scattered lymph nodes are
noted.

Reproductive: The prostate gland and seminal vesicles are
unremarkable.

Other: No free air or significant free fluid. There is a small
amount of simple appearing fluid in the pelvis which is likely
related to the small bowel process.

Left scrotal hydrocele and prominent vessels in the left inguinal
canal suggesting a varicocele.

Musculoskeletal: No significant bony findings. Sclerotic lesion
involving the left pubic bone is likely a benign bone island with no
other lesions are identified.
IMPRESSION: 1. Moderate mid small bowel inflammatory or infectious enteritis.
2. Left-sided scrotal hydrocele and varicocele.
3. Small amount of free pelvic fluid.  No free air.
4. The solid abdominal organs appear normal.

## 2019-10-16 ENCOUNTER — Encounter (HOSPITAL_COMMUNITY): Payer: Self-pay

## 2019-10-16 ENCOUNTER — Ambulatory Visit (HOSPITAL_COMMUNITY)
Admission: RE | Admit: 2019-10-16 | Discharge: 2019-10-16 | Disposition: A | Discharge status: Home / Self Care | Payer: Medicaid Other | Source: Ambulatory Visit | Attending: Cardiology | Admitting: Cardiology

## 2019-10-16 ENCOUNTER — Other Ambulatory Visit: Payer: Self-pay

## 2019-10-16 VITALS — BP 122/84 | HR 90 | Wt 192.0 lb

## 2019-10-16 DIAGNOSIS — I428 Other cardiomyopathies: Secondary | ICD-10-CM | POA: Insufficient documentation

## 2019-10-16 DIAGNOSIS — Z7982 Long term (current) use of aspirin: Secondary | ICD-10-CM | POA: Insufficient documentation

## 2019-10-16 DIAGNOSIS — I110 Hypertensive heart disease with heart failure: Secondary | ICD-10-CM | POA: Insufficient documentation

## 2019-10-16 DIAGNOSIS — I5022 Chronic systolic (congestive) heart failure: Secondary | ICD-10-CM | POA: Diagnosis not present

## 2019-10-16 DIAGNOSIS — Z86718 Personal history of other venous thrombosis and embolism: Secondary | ICD-10-CM | POA: Diagnosis not present

## 2019-10-16 DIAGNOSIS — F17210 Nicotine dependence, cigarettes, uncomplicated: Secondary | ICD-10-CM | POA: Insufficient documentation

## 2019-10-16 DIAGNOSIS — Z79899 Other long term (current) drug therapy: Secondary | ICD-10-CM | POA: Diagnosis not present

## 2019-10-16 DIAGNOSIS — Z8249 Family history of ischemic heart disease and other diseases of the circulatory system: Secondary | ICD-10-CM | POA: Diagnosis not present

## 2019-10-16 DIAGNOSIS — Z7901 Long term (current) use of anticoagulants: Secondary | ICD-10-CM | POA: Insufficient documentation

## 2019-10-16 DIAGNOSIS — I447 Left bundle-branch block, unspecified: Secondary | ICD-10-CM | POA: Diagnosis not present

## 2019-10-16 DIAGNOSIS — F1411 Cocaine abuse, in remission: Secondary | ICD-10-CM | POA: Insufficient documentation

## 2019-10-16 LAB — COMPREHENSIVE METABOLIC PANEL
ALT: 26 U/L (ref 0–44)
AST: 24 U/L (ref 15–41)
Albumin: 4 g/dL (ref 3.5–5.0)
Alkaline Phosphatase: 118 U/L (ref 38–126)
Anion gap: 11 (ref 5–15)
BUN: 13 mg/dL (ref 6–20)
CO2: 26 mmol/L (ref 22–32)
Calcium: 9.6 mg/dL (ref 8.9–10.3)
Chloride: 101 mmol/L (ref 98–111)
Creatinine, Ser: 0.97 mg/dL (ref 0.61–1.24)
GFR calc Af Amer: >60 mL/min (ref >60)
GFR calc non Af Amer: >60 mL/min (ref >60)
Glucose, Bld: 80 mg/dL (ref 70–99)
Potassium: 3.7 mmol/L (ref 3.5–5.1)
Sodium: 138 mmol/L (ref 135–145)
Total Bilirubin: 0.8 mg/dL (ref 0.3–1.2)
Total Protein: 7.2 g/dL (ref 6.5–8.1)

## 2019-10-16 MED ORDER — TORSEMIDE 20 MG PO TABS: 80.0000 mg | ORAL_TABLET | Freq: Two times a day (BID) | ORAL | 5 refills | Status: DC

## 2019-10-16 MED ORDER — SACUBITRIL-VALSARTAN 24-26 MG PO TABS: 1.0000 | ORAL_TABLET | Freq: Two times a day (BID) | ORAL | 5 refills | Status: DC

## 2019-10-16 MED ORDER — DIGOXIN 125 MCG PO TABS: 0.1250 mg | ORAL_TABLET | Freq: Every day | ORAL | 5 refills | Status: DC

## 2019-10-16 NOTE — Progress Notes (Signed)
ReDS Vest / Clip - 10/16/19 1419      ReDS Vest / Clip   Station Marker  D    Ruler Value  33    ReDS Value Range  (!) High volume overload    ReDS Actual Value  42

## 2019-10-16 NOTE — Patient Instructions (Addendum)
STOP Lasix  STOP Losartan  START Torsemide 80mg  (4 tabs) twice a day  START Entresto 24/26mg  (1 tab) twice a day  START Digoxin 0.125mg  (1 tab) daily  Labs today and repeat in 1 week (script provided) We will only contact you if something comes back abnormal or we need to make some changes. Otherwise no news is good news!  Your physician recommends that you schedule a follow-up appointment in: 2 weeks with the Nurse Practitioner/Physician Assistant  Monday May 24th 2021 at 9:30a Garage code [id number]  Your physician recommends that you schedule a follow-up appointment in: 6 weeks with Dr 5009  Wednesday June 23rd, 2021 at 9:20A Garage Code [id number]  Please call office at [phone] option [phone] if you have any questions or concerns.    At the [facility], you and your health needs are our priority. As part of our continuing mission to provide you with exceptional heart care, we have created designated Provider Care Teams. These Care Teams include your primary Cardiologist (physician) and Advanced Practice Providers (APPs- Physician Assistants and Nurse Practitioners) who all work together to provide you with the care you need, when you need it.   You may see any of the following providers on your designated Care Team at your next follow up: [phone] Dr Marland Kitchen . Dr Arvilla Meres . Marca Ancona, NP . Tonye Becket, PA . Robbie Lis, PharmD   Please be sure to bring in all your medications bottles to every appointment.

## 2019-10-16 NOTE — Progress Notes (Signed)
[facility] Note   Referring Physician: PCP: Patient, No Pcp Per PCP-Cardiologist: [name]/Dr. [name]   HPI: Paul Mccall is a 49 y.o. male with a history of chronic systolic heart failure due to NICM, HTN, bipolar disorder, h/o cocaine abuse, tobacco abuse, and medication noncompliance.   He was seen in [facility] 7 times in 2019 with CP +/- volume overload associated with medication noncompliance and ongoing cocaine use. Typically given IV lasix with improvement.   Seen in clinic by his cardiologist [name] at [facility] 01/2018. He was given IV lasix in clinic with 15 lb weight gain. Declined admission. He had been taking lasix incorrectly. He did not go to follow up. Weight was 194 lbs at that time.   He was admitted to [facility] 06/01/18-06/09/18 with A/C volume overload. He had not been taking medications due to confusion and cost. He had used cocaine within the week. EKG showed new LBBB at that time. Troponin was negative. Thought to be secondary to worsening CM and not ischemia. Diuresed with IV lasix. Advanced HF team consulted. Added lisinopril and spiro to his regimen. RHC was completed (see below). He was discharged on lasix 40 mg BID. DC weight 156 lbs. He was also treated for tooth abscess with amoxicillin.   Hospitalized Jan 2020 for acute on chronic systolic HF w/ low output and required milrinone. EF 15-20%, dilated CM, anteroseptal, inferoseptal, and inferior akinesis. Otherwise severe global hypokinesis, grade 3 DD, 2.6 cm mobile thrombus at apical lateral myocardium, severely dilated LA, mildly dilated RA, moderate MR, mild calcification of aortic valve. Started on coumadin w/ heparin bridge. Was weaned off milrinone w/ marginal co-ox ~64% but not felt to be a candidate for home inotrope's nor advanced therapies due to drug abuse. He was placed on GDMT and discharged from [facility] but failed to f/u in the [facility]. He has however been followed by [facility] and sees  Dr. [name].  Since his last admit at [facility] in 07/2018, he was also incarcerated x 4 months and several of his meds were changed. Since being released from prison, he denies any further cocaine use.   Per Care Everywhere, he was recently admitted to [facility] 4/21 for a/c CHF exacerbation. They started him on IV Lasix but he left AMA after 3 days. Reports he wanted to come back to [facility] for a second opinion and further care.  Per review of records, his UDS at [facility] was + only for [facility] Negative for cocaine. 2D Echo showed LVEF 15%. RV systolic function mildly reduced. SCr was normal at 1.2. K 3.8.    Since returning home, he has been on lasix 120 mg bid w/o much improvement. Abdomen distended. No LEE. Complaints of exertional dyspnea and 3 pillow orthopnea. Denies resting dyspnea. NYHA Class III. Reports full med compliance. Continues to deny cocaine use. He continues to smoke cigarettes, ~ 1/2 ppd. He is currently living in [place] w/ his girlfriend. She is here with him today and is very involved in his care. She seems very supportive.    SH: Living in [place] w/ his Girlfriend. Has Medicaid. Incarcerated x 4 months in 2020. Prior poly subtance abuse but has been cocaine free x 8 months. Continues to smoke cigarettes, 1/2 ppd. Using THC gummies.    FH: His father had an MI at 95 years old and has CHF. No other family hx of CAD, CHF, or SCD.  He presented to [facility] on 07/11/18 with worsening SOB at  rest, CP, and abdominal pain. Pertinent admission labs include: BNP 1099 (no previous), Na 140, K 3.1, creatinine 1.28, WBC 12.8, hemoglobin 13.8, troponin 0.03 CXR: CM and mild chronic ILD EKG: Sinus tach 103 bpm with LBBB (he had LBBB on last EKG at [facility] 06/2018, but QRS was narrow in 02/2017). Abdominal US: negative  He was given 40 mg IV lasix x 2 with modest diuresis. Creatinine 1.40, K 3.1 today. AST 52 and ALT 63. He is in sinus tach 110s. SBP 100s. 5 beats NSVT on tele.  He still feels  SOB. No CP currently. Worried he is going to die.   RHC 06/09/18: Hemodynamics (mmHg) RA mean 2 RV 39/7 PA 37/14 (24) PCWP 14 Cardiac Output (Fick) 4.1 Cardiac Index (Fick) 2.24 PVR (MAP-CVP/CO) x 80: 195 SVR 1376  LHC 12/14/2016: no angiographic evidence of significant coaronary disease  Echo 09/2017: EF 30-35%, mild MR, normal RV  Echo 08/2016: EF 35%, normal RV  Echo 09/2019 ([name]): EF 15%. RV systolic function mildly reduce   Review of Systems: [y] = yes, [ ]  = no   General: Weight gain [ ] ; Weight loss [ ] ; Anorexia [ ] ; Fatigue [ ] ; Fever [ ] ; Chills [ ] ; Weakness [ ]   Cardiac: Chest pain/pressure [ ] ; Resting SOB [ ] ; Exertional SOB [ ] ; Orthopnea [ ] ; Pedal Edema [ ] ; Palpitations [ ] ; Syncope [ ] ; Presyncope [ ] ; Paroxysmal nocturnal dyspnea[ ]   Pulmonary: Cough [ ] ; Wheezing[ ] ; Hemoptysis[ ] ; Sputum [ ] ; Snoring [ ]   GI: Vomiting[ ] ; Dysphagia[ ] ; Melena[ ] ; Hematochezia [ ] ; Heartburn[ ] ; Abdominal pain [ ] ; Constipation [ ] ; Diarrhea [ ] ; BRBPR [ ]   GU: Hematuria[ ] ; Dysuria [ ] ; Nocturia[ ]   Vascular: Pain in legs with walking [ ] ; Pain in feet with lying flat [ ] ; Non-healing sores [ ] ; Stroke [ ] ; TIA [ ] ; Slurred speech [ ] ;  Neuro: Headaches[ ] ; Vertigo[ ] ; Seizures[ ] ; Paresthesias[ ] ;Blurred vision [ ] ; Diplopia [ ] ; Vision changes [ ]   Ortho/Skin: Arthritis [ ] ; Joint pain [ ] ; Muscle pain [ ] ; Joint swelling [ ] ; Back Pain [ ] ; Rash [ ]   Psych: Depression[ ] ; Anxiety[ ]   Heme: Bleeding problems [ ] ; Clotting disorders [ ] ; Anemia [ ]   Endocrine: Diabetes [ ] ; Thyroid dysfunction[ ]    Past Medical History:  Diagnosis Date  . CHF (congestive heart failure) (HCC)   . Dental infection   . Enlarged heart   . Hernia, inguinal, right   . Hypertension     Current Outpatient Medications  Medication Sig Dispense Refill  . aspirin EC 81 MG tablet Take 81 mg by mouth daily.    atorvastatin (LIPITOR) 20 MG tablet Take 20 mg by mouth daily.    . hydrOXYzine  (ATARAX/VISTARIL) 25 MG tablet Take 25 mg by mouth 3 (three) times daily as needed.    . naproxen sodium (ALEVE) 220 MG tablet Take 220 mg by mouth daily as needed.    . potassium chloride SA (KLOR-CON) 20 MEQ tablet Take 40 mEq by mouth 2 (two) times daily.    . rivaroxaban (XARELTO) 20 MG TABS tablet Take 20 mg by mouth daily with supper.    spironolactone (ALDACTONE) 25 MG tablet Take 1 tablet (25 mg total) by mouth at bedtime. 30 tablet 0  . digoxin (LANOXIN) 0.125 MG tablet Take 1 tablet (0.125 mg total) by mouth daily. 30 tablet 5  . sacubitril-valsartan (ENTRESTO) 24-26 MG Take 1  tablet by mouth 2 (two) times daily. 60 tablet 5  . torsemide (DEMADEX) 20 MG tablet Take 4 tablets (80 mg total) by mouth 2 (two) times daily. 240 tablet 5   No current facility-administered medications for this encounter.    No Known Allergies    Social History   Socioeconomic History  . Marital status: Significant Other    Spouse name: Not on file  . Number of children: 2  . Years of education: Not on file  . Highest education level: Not on file  Occupational History  . Not on file  Tobacco Use  . Smoking status: Current Every Day Smoker  . Smokeless tobacco: Never Used  Substance and Sexual Activity  . Alcohol use: Yes    Comment: occasionally  . Drug use: Not on file  . Sexual activity: Not on file  Other Topics Concern  . Not on file  Social History Narrative   07/19/18- Pt transportation issues due to inability to afford gas- has car but does not have job- disability application to be completed with [employer].  Pt also reported food insecurity- food resources provided.   Social Determinants of Health   Financial Resource Strain:   . Difficulty of Paying Living Expenses:   Food Insecurity:   . Worried About Charity fundraiser in the Last Year:   . Arboriculturist in the Last Year:   Transportation Needs:   . Film/video editor (Medical):   Marland Kitchen Lack of Transportation  (Non-Medical):   Physical Activity:   . Days of Exercise per Week:   . Minutes of Exercise per Session:   Stress:   . Feeling of Stress :   Social Connections:   . Frequency of Communication with Friends and Family:   . Frequency of Social Gatherings with Friends and Family:   . Attends Religious Services:   . Active Member of Clubs or Organizations:   . Attends Archivist Meetings:   Marland Kitchen Marital Status:   Intimate Partner Violence:   . Fear of Current or Ex-Partner:   . Emotionally Abused:   Marland Kitchen Physically Abused:   . Sexually Abused:      No family history on file.  Vitals:   10/16/19 1330  BP: 122/84  Pulse: 90  SpO2: 98%  Weight: 87.1 kg (192 lb)     PHYSICAL EXAM: ReDs Clip 42%  General:  Well appearing. No respiratory difficulty HEENT: normal Neck: supple. no JVD. Carotids 2+ bilat; no bruits. No lymphadenopathy or thyromegaly appreciated. Cor: PMI nondisplaced. Regular rate & rhythm. No rubs, gallops or murmurs. Lungs: clear Abdomen: soft, nontender, nondistended. No hepatosplenomegaly. No bruits or masses. Good bowel sounds. Extremities: no cyanosis, clubbing, rash, edema Neuro: alert & oriented x 3, cranial nerves grossly intact. moves all 4 extremities w/o difficulty. Affect pleasant.  ECG: NSR 89 bpm, LBBB    ASSESSMENT & PLAN:  1. Chronic Systolic HF - Chronic systolic HFdue to NICM from cocaine abuse.  [place] 2018 no significant coronary disease. Echo 09/2017: EF 30-35%. Echo 2/20: EF 15-20%. RHC (06/09/18) with CO 4.1, CI 2.24. Echo 07/14/18: EF 15-20%, dilated CM, anteroseptal, inferoseptal, and inferior akinesis. Otherwise severe global hypokinesis, grade 3 DD, 2.6 cm mobile thrombus at apical lateral myocardium, severely dilated LA, mildly dilated RA, moderate MR, mild calcification of aortic valve. RV mild to moderate HK.  Echo repeated at [facility] 09/2019: EF 15%. RV systolic function mildly reduced - Currently NYHA Class III symptoms.  -  Volume  overloaded w/ mainly abdominal edema. ReDs Clip 42%.  - Stop Lasix. Start Torsemide 80 mg bid. Continue KCl 40 mEq bid - Stop Losartan. Start Entresto 24-26 mg bid - Start Digoxin 0.125 mg daily  - Check CMP today. - Check BMP and Dig level in 1 week - F/u in 2-3 weeks to reassess volume status and to further optimize meds - Now that he has been cocaine free x 8 months w/ recent negative UDS, can consider w/u for more advanced therapies. Not a candidate for transplant given ongoing tobacco abuse. May be a candidate for LVAD. Appears to have good social support currently (Girlfriend is very supportive).  - Will plan RHC and CPX testing once volume status improves.  - Will also need repeat echo after 3 months once meds optimized. LBBB noted on EKG. Would qualify for CRT-D if EF remains < 35%   2. Prior h/o cocaine use - reports he has not used in 8 months - UDS at [facility] 09/2019 negative (see in Care Everywhere)  3. Tobacco Abuse - still smoke 1/2 ppd - encouraged to quit   4. H/o LV Thrombus: - noted on echo 07/2018 - was on coumadin - now on Xarelto and reports full compliance - echo repeated at [facility] 3/21 and not visualized - continue Xarelto given persistently low EF 15%  5. Social: - assessed for needs. Has housing. Living w/ girlfriend and has Medicaid and able to get meds. Has reliable transportation to get to appointments.   F/u every 2-3 weeks w/ APP or PharmD for further med titration. Dr. [name] next available (mid June)    [address] Sharol Harness, Cordelia Poche 10/16/19

## 2019-10-17 ENCOUNTER — Other Ambulatory Visit (HOSPITAL_COMMUNITY): Payer: Self-pay | Admitting: *Deleted

## 2019-10-18 ENCOUNTER — Other Ambulatory Visit (HOSPITAL_COMMUNITY): Payer: Self-pay | Admitting: *Deleted

## 2019-10-18 ENCOUNTER — Encounter (HOSPITAL_COMMUNITY): Payer: Self-pay

## 2019-10-18 MED ORDER — RIVAROXABAN 20 MG PO TABS: 20.0000 mg | ORAL_TABLET | Freq: Every day | ORAL | 3 refills | Status: DC

## 2019-10-30 ENCOUNTER — Encounter (HOSPITAL_COMMUNITY): Payer: Medicaid Other

## 2019-10-30 ENCOUNTER — Encounter (HOSPITAL_COMMUNITY): Payer: Self-pay

## 2019-11-27 ENCOUNTER — Telehealth (HOSPITAL_COMMUNITY): Payer: Self-pay | Admitting: Pharmacy Technician

## 2019-11-27 ENCOUNTER — Encounter (HOSPITAL_COMMUNITY): Payer: Self-pay

## 2019-11-27 NOTE — Telephone Encounter (Signed)
Advanced Heart Failure Patient Advocate Encounter  Prior Authorization for Sherryll Burger has been approved.    PA# [id number] Effective dates: 11/27/19 through 11/26/20  Archer Asa, CPhT

## 2019-11-28 ENCOUNTER — Other Ambulatory Visit (HOSPITAL_COMMUNITY): Payer: Self-pay | Admitting: *Deleted

## 2019-11-28 NOTE — Progress Notes (Signed)
Patient no showed. Error.

## 2019-11-29 ENCOUNTER — Inpatient Hospital Stay (HOSPITAL_COMMUNITY)
Admission: RE | Admit: 2019-11-29 | Discharge: 2019-11-29 | Disposition: A | Discharge status: Home / Self Care | Payer: Medicaid Other | Source: Ambulatory Visit | Attending: Internal Medicine | Admitting: Internal Medicine

## 2020-02-20 ENCOUNTER — Encounter (HOSPITAL_COMMUNITY): Payer: Medicaid Other

## 2020-04-11 ENCOUNTER — Telehealth (HOSPITAL_COMMUNITY): Payer: Self-pay | Admitting: Vascular Surgery

## 2020-04-11 MED ORDER — TORSEMIDE 20 MG PO TABS: 80.0000 mg | ORAL_TABLET | Freq: Two times a day (BID) | ORAL | 0 refills | Status: DC

## 2020-04-11 MED ORDER — POTASSIUM CHLORIDE CRYS ER 20 MEQ PO TBCR: 40.0000 meq | EXTENDED_RELEASE_TABLET | Freq: Two times a day (BID) | ORAL | 0 refills | Status: DC

## 2020-04-11 MED ORDER — ATORVASTATIN CALCIUM 20 MG PO TABS: 20.0000 mg | ORAL_TABLET | Freq: Every day | ORAL | 0 refills | Status: DC

## 2020-04-11 MED ORDER — DIGOXIN 125 MCG PO TABS: 0.1250 mg | ORAL_TABLET | Freq: Every day | ORAL | 0 refills | Status: DC

## 2020-04-11 MED ORDER — SPIRONOLACTONE 25 MG PO TABS: 25.0000 mg | ORAL_TABLET | Freq: Every day | ORAL | 0 refills | Status: DC

## 2020-04-11 MED ORDER — SACUBITRIL-VALSARTAN 24-26 MG PO TABS: 1.0000 | ORAL_TABLET | Freq: Two times a day (BID) | ORAL | 0 refills | Status: DC

## 2020-04-11 NOTE — Telephone Encounter (Signed)
Pt called to get refills of all heart meds sent to [facility] on [address], pt made appt 11/22.Paul Mccall

## 2020-04-26 IMAGING — US US ABDOMEN COMPLETE
1 series · 14 of 25 positions shown · non-contrast
Comparison: None.

CLINICAL DATA: Upper abdominal pain.

EXAM:
ABDOMEN ULTRASOUND COMPLETE

[Series 1: us abdomen complete · 0.19mm/px · 14 of 94 slices shown]
[im 1/94]
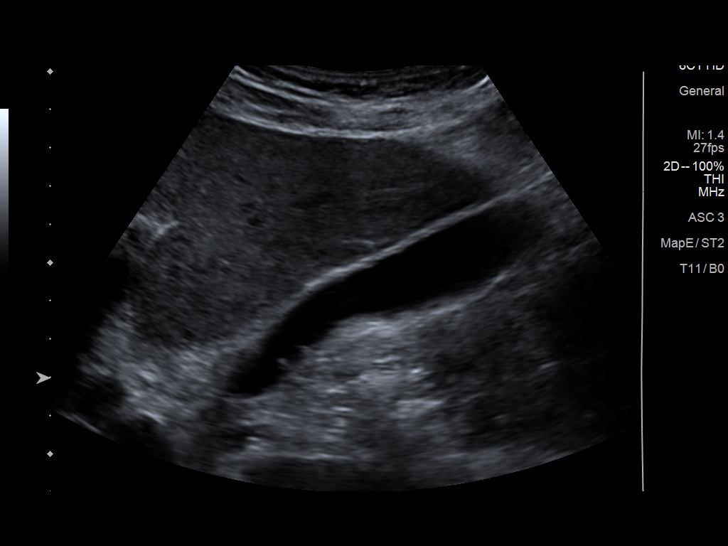
[im 8/94]
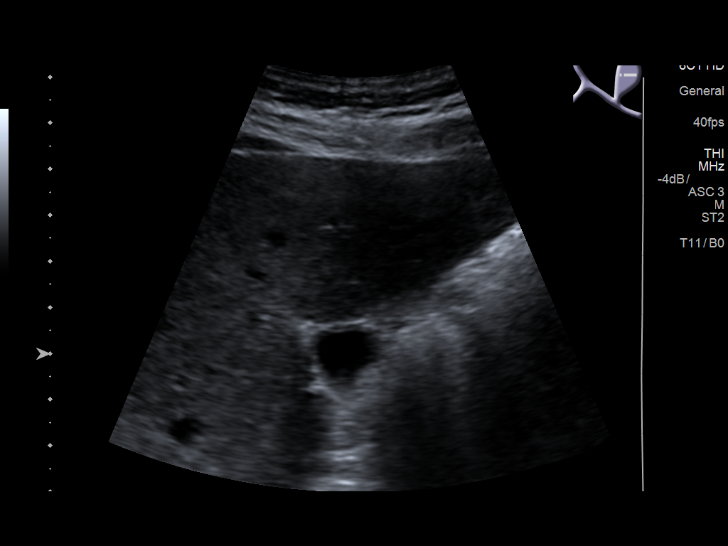
[im 16/94]
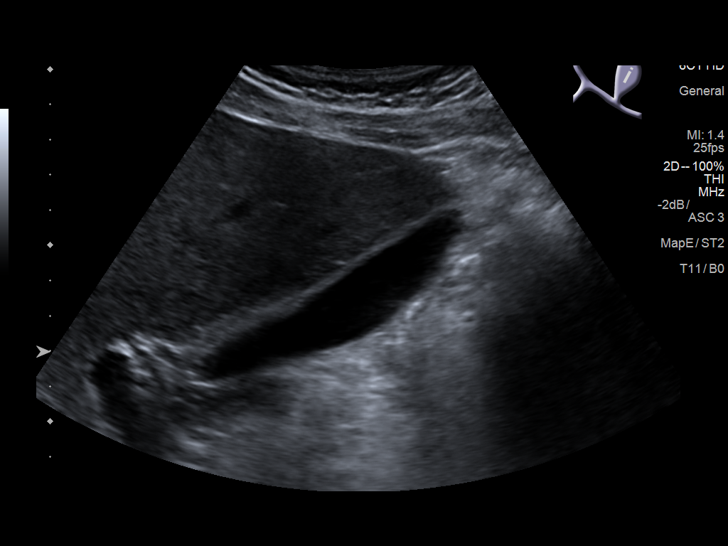
[im 24/94]
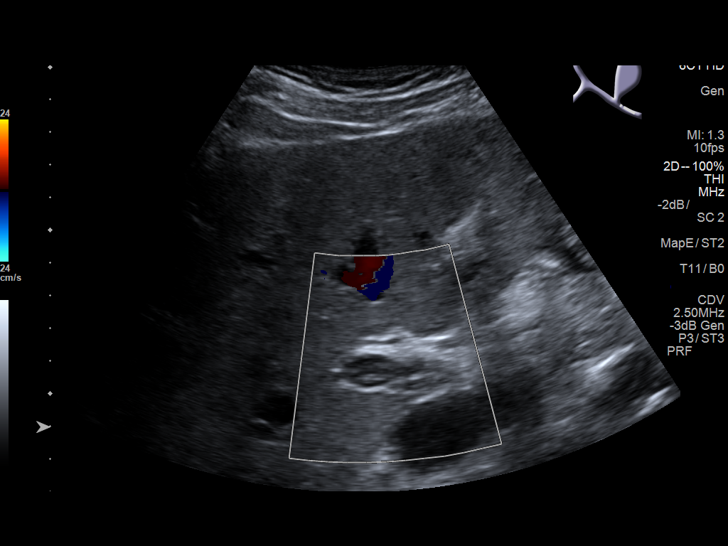
[im 32/94]
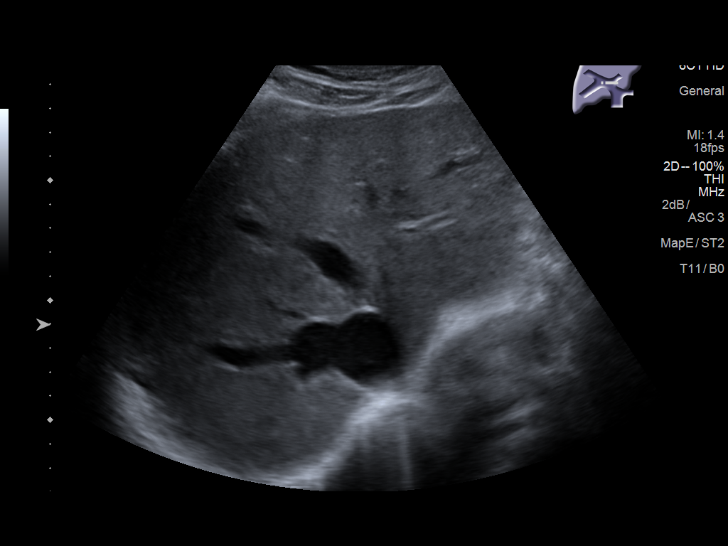
[im 35/94]
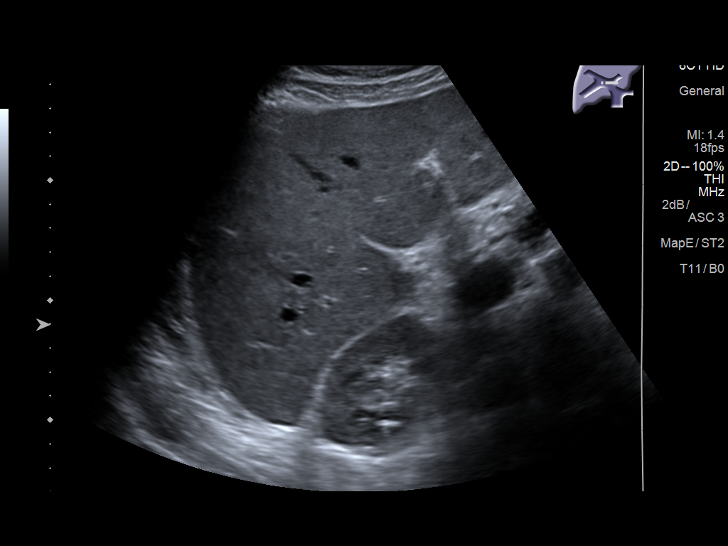
[im 43/94]
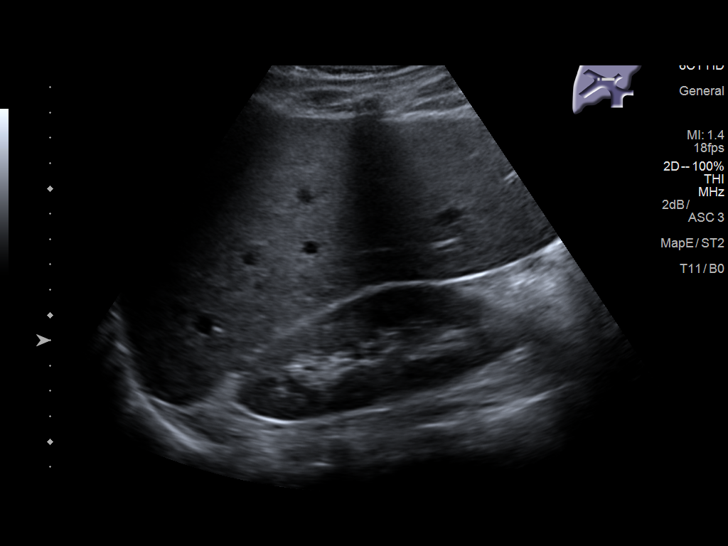
[im 51/94]
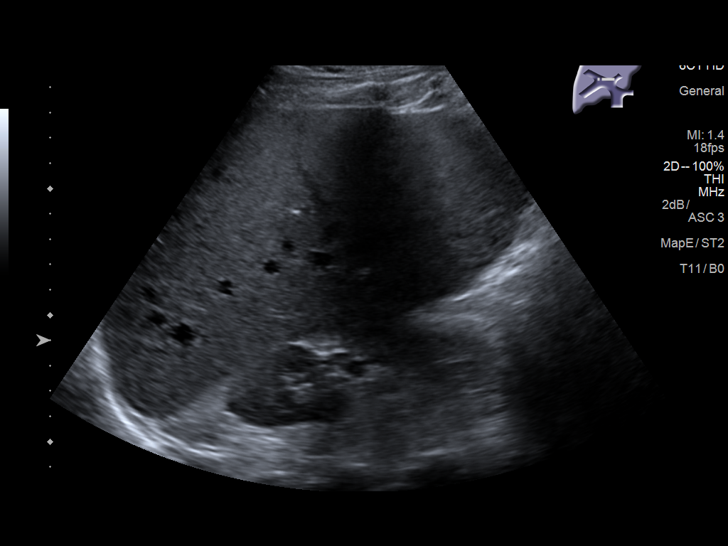
[im 59/94]
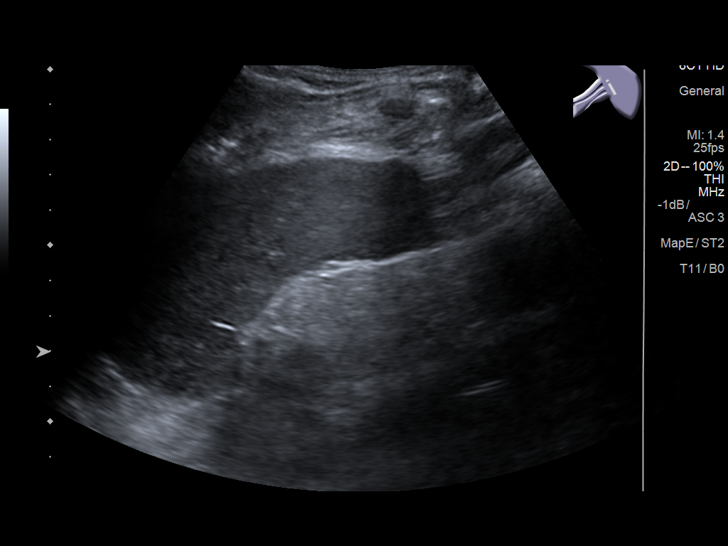
[im 63/94]
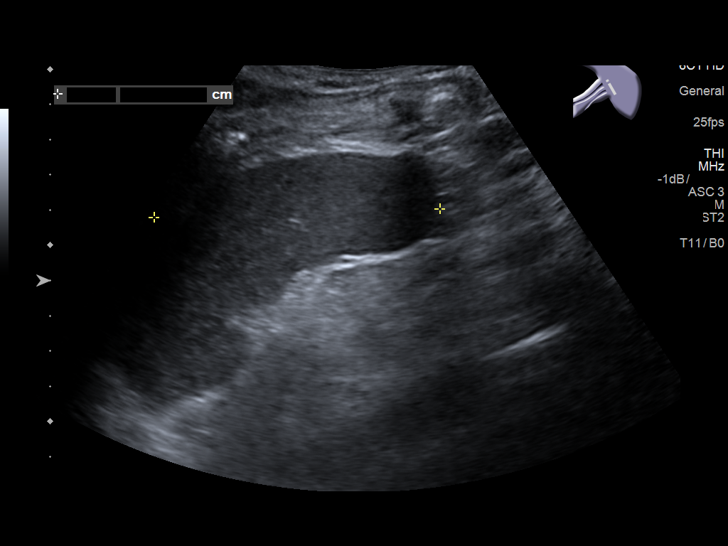
[im 70/94]
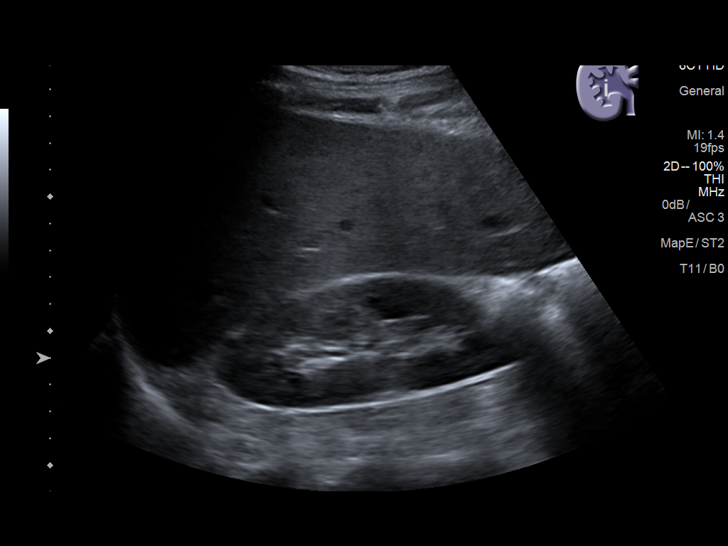
[im 78/94]
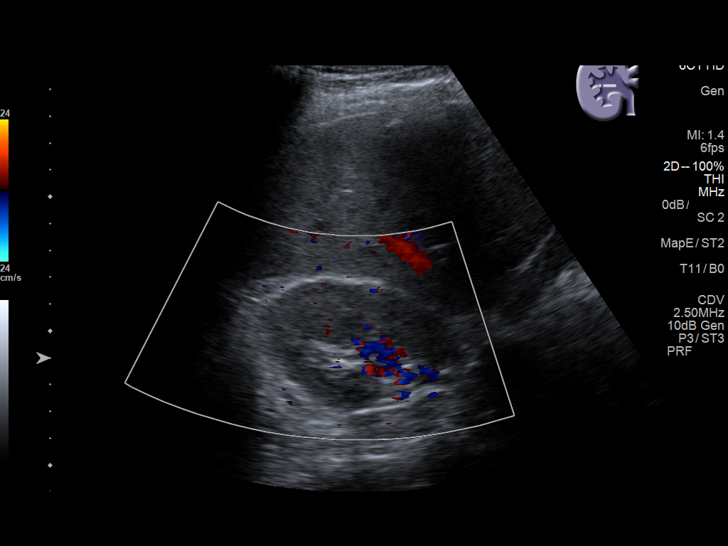
[im 86/94]
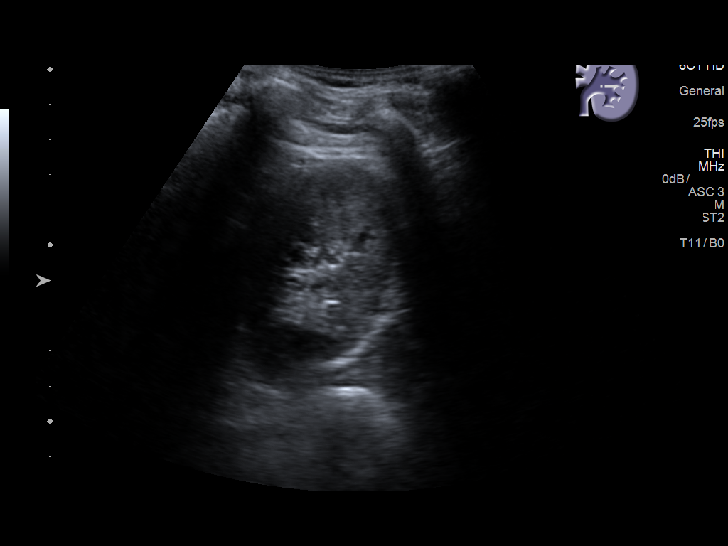
[im 94/94]
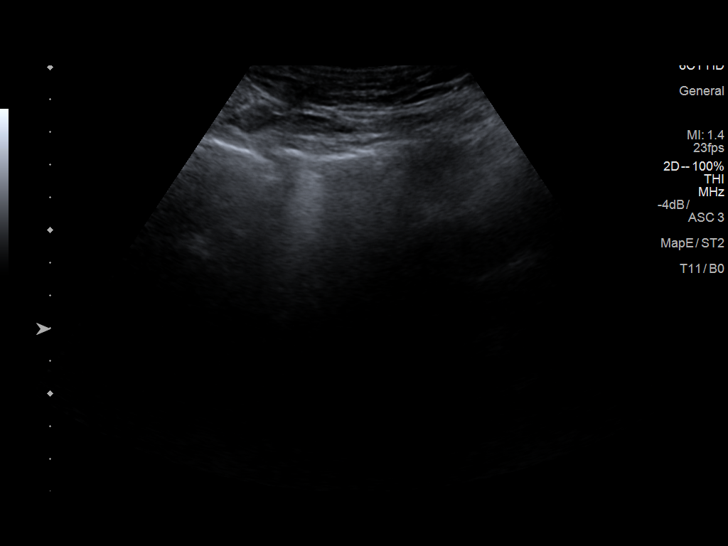

[14 of 25 positions shown; findings below may reference images not displayed]

FINDINGS: Gallbladder: No gallstones or wall thickening visualized. No
sonographic Murphy sign noted by sonographer.

Common bile duct: Diameter: 3.3 mm

Liver: No focal lesion identified. Within normal limits in
parenchymal echogenicity. Portal vein is patent on color Doppler
imaging with normal direction of blood flow towards the liver.

IVC: No abnormality visualized.

Pancreas: Visualized portion unremarkable.

Spleen: Size and appearance within normal limits.

Right Kidney: Length: 11.5 x 4.5 x 5.6 cm. Echogenicity within
normal limits. No mass or hydronephrosis visualized.

Left Kidney: Length: 10.6 x 6.1 x 4.9 cm. Echogenicity within normal
limits. No mass or hydronephrosis visualized.

Abdominal aorta: No aneurysm visualized.

Other findings: None.
IMPRESSION: No cause for pain identified.

## 2020-04-29 ENCOUNTER — Telehealth (HOSPITAL_COMMUNITY): Payer: Self-pay | Admitting: Cardiology

## 2020-04-29 ENCOUNTER — Encounter (HOSPITAL_COMMUNITY): Payer: Medicaid Other

## 2020-04-29 NOTE — Telephone Encounter (Signed)
Attempting to contact patient for appointment reminder No answer unable to leave message

## 2020-06-06 ENCOUNTER — Other Ambulatory Visit (HOSPITAL_COMMUNITY): Payer: Self-pay | Admitting: Internal Medicine

## 2020-06-26 NOTE — Progress Notes (Incomplete)
Advanced Heart Failure Clinic Note   Referring Physician: PCP: Patient, No Pcp Per PCP-Cardiologist: [name]/Dr. [name]   HPI: Paul Mccall is a 50 y.o. male with a history of chronic systolic heart failure due to NICM, HTN, bipolar disorder, h/o cocaine abuse, tobacco abuse, and medication noncompliance.   He was seen in [facility] 7 times in 2019 with CP +/- volume overload associated with medication noncompliance and ongoing cocaine use. Typically given IV lasix with improvement.   Seen in clinic by his cardiologist [name] at [facility] 01/2018. He was given IV lasix in clinic with 15 lb weight gain. Declined admission. He had been taking lasix incorrectly. He did not go to follow up. Weight was 194 lbs at that time.   He was admitted to [facility] 06/01/18-06/09/18 with A/C volume overload. He had not been taking medications due to confusion and cost. He had used cocaine within the week. EKG showed new LBBB at that time. Troponin was negative. Thought to be secondary to worsening CM and not ischemia. Diuresed with IV lasix. Advanced HF team consulted. Added lisinopril and spiro to his regimen. RHC was completed (see below). He was discharged on lasix 40 mg BID. DC weight 156 lbs. He was also treated for tooth abscess with amoxicillin.   Hospitalized Jan 2020 for acute on chronic systolic HF w/ low output and required milrinone. EF 15-20%, dilated CM, anteroseptal, inferoseptal, and inferior akinesis. Otherwise severe global hypokinesis, grade 3 DD, 2.6 cm mobile thrombus at apical lateral myocardium, severely dilated LA, mildly dilated RA, moderate MR, mild calcification of aortic valve. Started on Coumadin w/ heparin bridge. Was weaned off milrinone w/ marginal co-ox ~64% but not felt to be a candidate for home inotrope's nor advanced therapies due to drug abuse. He was placed on GDMT and discharged from [facility] but failed to f/u in the [facility]. He has however been followed by [facility] and sees  Dr. [name].  Since his last admit at [facility] in 07/2018, he was also incarcerated x 4 months and several of his meds were changed. Since being released from prison, he denies any further cocaine use.   Per Care Everywhere, he was recently admitted to [facility] 4/21 for a/c CHF exacerbation. They started him on IV Lasix but he left AMA after 3 days. Reports he wanted to come back to [facility] for a second opinion and further care.  Per review of records, his UDS at [facility] was + only for [facility]. Negative for cocaine. 2D Echo showed LVEF 15%. RV systolic function mildly reduced. SCr was normal at 1.2. K 3.8.    He presented to [facility] on 07/11/18 with worsening SOB at rest, CP, and abdominal pain. BNP 1099 (no previous), Na 140, K 3.1, creatinine 1.28, WBC 12.8, hemoglobin 13.8, troponin 0.03. CXR: CM and mild chronic ILD, EKG: Sinus tach 103 bpm with LBBB (he had LBBB on last EKG at [facility] 06/2018, but QRS was narrow in 02/2017), Abdominal US: negative. He was given 40 mg IV lasix x 2 with modest diuresis.   OV for HF follow up on 5/21 he had been on lasix 120 mg bid w/o much improvement. NYHA Class III. He was  living in [place] w/ his girlfriend. She was very involved in his care. Reds was 42%. Lasix stopped, torsemide 80 mg bid started, losartan stopped & Entresto 24/26 started, & digoxin started. Plans for repeat echo once diuresed and GDMT optimized.  Today he returns for HF follow up. Overall  feeling fine. Denies increasing SOB, CP, dizziness, edema, or PND/Orthopnea. Appetite ok. No fever or chills. Weight at home 170 pounds. Taking all medications.    RHC 06/09/18: Hemodynamics (mmHg) RA mean 2 RV 39/7 PA 37/14 (24) PCWP 14 Cardiac Output (Fick) 4.1 Cardiac Index (Fick) 2.24 PVR (MAP-CVP/CO) x 80: 195 SVR 1376  LHC 12/14/2016: no angiographic evidence of significant coaronary disease Echo 08/2016: EF 35%, normal RV Echo 09/2017: EF 30-35%, mild MR, normal RV  Echo 09/2019 ([name]): EF 15%. RV systolic  function mildly reduce   ROS: All systems reviewed and negative except as per HPI.    Past Medical History:  Diagnosis Date  . CHF (congestive heart failure) (HCC)   . Dental infection   . Enlarged heart   . Hernia, inguinal, right   . Hypertension     Current Outpatient Medications  Medication Sig Dispense Refill  . aspirin EC 81 MG tablet Take 81 mg by mouth daily.    Marland Kitchen atorvastatin (LIPITOR) 20 MG tablet Take 1 tablet (20 mg total) by mouth daily. 30 tablet 0  . digoxin (LANOXIN) 0.125 MG tablet Take 1 tablet (125 mcg total) by mouth daily. Needs appt for future refills 30 tablet 0  . hydrOXYzine (ATARAX/VISTARIL) 25 MG tablet Take 25 mg by mouth 3 (three) times daily as needed.    . naproxen sodium (ALEVE) 220 MG tablet Take 220 mg by mouth daily as needed.    . potassium chloride SA (KLOR-CON) 20 MEQ tablet Take 2 tablets (40 mEq total) by mouth 2 (two) times daily. 120 tablet 0  . rivaroxaban (XARELTO) 20 MG TABS tablet Take 1 tablet (20 mg total) by mouth daily with supper. 30 tablet 3  . sacubitril-valsartan (ENTRESTO) 24-26 MG Take 1 tablet by mouth 2 (two) times daily. 60 tablet 0  . spironolactone (ALDACTONE) 25 MG tablet Take 1 tablet (25 mg total) by mouth at bedtime. 30 tablet 0  . torsemide (DEMADEX) 20 MG tablet Take 4 tablets (80 mg total) by mouth 2 (two) times daily. 240 tablet 0   No current facility-administered medications for this visit.    No Known Allergies    Social History   Socioeconomic History  . Marital status: Significant Other    Spouse name: Not on file  . Number of children: 2  . Years of education: Not on file  . Highest education level: Not on file  Occupational History  . Not on file  Tobacco Use  . Smoking status: Current Every Day Smoker  . Smokeless tobacco: Never Used  Substance and Sexual Activity  . Alcohol use: Yes    Comment: occasionally  . Drug use: Not on file  . Sexual activity: Not on file  Other Topics Concern  .  Not on file  Social History Narrative   07/19/18- Pt transportation issues due to inability to afford gas- has car but does not have job- disability application to be completed with [employer].  Pt also reported food insecurity- food resources provided.   Social Determinants of Health   Financial Resource Strain: Not on file  Food Insecurity: Not on file  Transportation Needs: Not on file  Physical Activity: Not on file  Stress: Not on file  Social Connections: Not on file  Intimate Partner Violence: Not on file   SH: Living in [place] w/ his Girlfriend. Has Medicaid. Incarcerated x 4 months in 2020. Prior poly subtance abuse but has been cocaine free x 8 months. Continues to smoke  cigarettes, 1/2 ppd. Using THC gummies.   FH: His father had an MI at 69 years old and has CHF. No other family hx of CAD, CHF, or SCD.    Family History  Problem Relation Age of Onset  . CAD Father   . Heart failure Father     There were no vitals filed for this visit.   PHYSICAL EXAM: ReDs Clip  General:  Well appearing. No respiratory difficulty HEENT: normal Neck: supple. no JVD. Carotids 2+ bilat; no bruits. No lymphadenopathy or thyromegaly appreciated. Cor: PMI nondisplaced. Regular rate & rhythm. No rubs, gallops or murmurs. Lungs: clear Abdomen: soft, nontender, nondistended. No hepatosplenomegaly. No bruits or masses. Good bowel sounds. Extremities: no cyanosis, clubbing, rash, edema Neuro: alert & oriented x 3, cranial nerves grossly intact. moves all 4 extremities w/o difficulty. Affect pleasant.  ECG:    ASSESSMENT & PLAN:  1. Chronic Systolic HF - Chronic systolic HFdue to NICM from cocaine abuse.  LHC 2018 no significant coronary disease. Echo 09/2017: EF 30-35%. Echo 2/20: EF 15-20%. RHC (06/09/18) with CO 4.1, CI 2.24. Echo 07/14/18: EF 15-20%, dilated CM, anteroseptal, inferoseptal, and inferior akinesis. Otherwise severe global hypokinesis, grade 3 DD, 2.6 cm mobile  thrombus at apical lateral myocardium, severely dilated LA, mildly dilated RA, moderate MR, mild calcification of aortic valve. RV mild to moderate HK.  Echo repeated at [facility] 09/2019: EF 15%. RV systolic function mildly reduced - Currently NYHA Class III symptoms.  - Volume overloaded w/ mainly abdominal edema. ReDs Clip 42%.  - Continue Torsemide 80 mg bid. Continue KCl 40 mEq bid - Continue Entresto 24-26 mg bid - Continue Digoxin 0.125 mg daily  - BMET & digoxin level today.  - Now that he has been cocaine free x 8 months w/ recent negative UDS, can consider w/u for more advanced therapies. Not a candidate for transplant given ongoing tobacco abuse. May be a candidate for LVAD. Appears to have good social support currently (Girlfriend is very supportive).  - Will plan RHC and CPX testing once volume status improves.  - Will also need repeat echo after 3 months once meds optimized. LBBB noted on EKG. Would qualify for CRT-D if EF remains < 35%   2. Prior h/o cocaine use - Reports he has not used in 8 months - UDS at [facility] 09/2019 negative (see in Care Everywhere)  3. Tobacco Abuse - Still smokes 1/2 ppd - Encouraged to quit   4. H/o LV Thrombus: - Noted on echo 07/2018 - Was on coumadin, now on Xarelto and reports full compliance - Echo repeated at [facility] 3/21 and not visualized. - Continue Xarelto given persistently low EF 15%, no bleeding issues.  5. Social: - Assessed for needs. Has housing. Living w/ girlfriend and has Medicaid and able to get meds. Has reliable transportation to get to appointments.   F/u every 2-3 weeks w/ APP or PharmD for further med titration. Dr. [name] next available.   Anderson Malta Alma, FNP 06/26/20

## 2020-06-27 ENCOUNTER — Telehealth (HOSPITAL_COMMUNITY): Payer: Self-pay | Admitting: Cardiology

## 2020-06-27 ENCOUNTER — Encounter (HOSPITAL_COMMUNITY): Payer: Medicaid Other

## 2020-06-27 NOTE — Telephone Encounter (Signed)
Attempted to contact patient for appointment reminder Call answered and call disconnected (hung up)

## 2020-07-05 ENCOUNTER — Other Ambulatory Visit (HOSPITAL_COMMUNITY): Payer: Self-pay | Admitting: Cardiology

## 2020-07-25 ENCOUNTER — Other Ambulatory Visit (HOSPITAL_COMMUNITY): Payer: Self-pay | Admitting: Cardiology

## 2020-08-08 ENCOUNTER — Other Ambulatory Visit (HOSPITAL_COMMUNITY): Payer: Self-pay | Admitting: Internal Medicine

## 2020-08-12 ENCOUNTER — Other Ambulatory Visit (HOSPITAL_COMMUNITY): Payer: Self-pay | Admitting: Internal Medicine

## 2020-08-20 ENCOUNTER — Other Ambulatory Visit (HOSPITAL_COMMUNITY): Payer: Self-pay

## 2020-08-21 MED ORDER — DIGOXIN 125 MCG PO TABS: 0.1250 mg | ORAL_TABLET | Freq: Every day | ORAL | 0 refills | Status: DC

## 2020-08-29 ENCOUNTER — Encounter (HOSPITAL_COMMUNITY): Payer: Self-pay

## 2020-08-29 ENCOUNTER — Encounter (HOSPITAL_COMMUNITY): Payer: Medicaid Other

## 2020-08-29 ENCOUNTER — Telehealth (HOSPITAL_COMMUNITY): Payer: Self-pay | Admitting: Licensed Clinical Social Worker

## 2020-08-29 NOTE — Telephone Encounter (Signed)
CSW informed pt had no show to appt this morning.  This is pt second no show for 2022.  Attempted to reach out by phone to discuss no show and inform pt he is at risk of being discharged from the clinic but phone does not appear to be working.  CSW emailed pt using email address on file to inform of reason for contact and risk of being unable to be seen in clinic- awaiting return email or call to discuss further  Burna Sis, LCSW Clinical Social Worker Advanced [facility] Clinic Desk#: [phone] Cell#: [phone]

## 2020-08-31 ENCOUNTER — Other Ambulatory Visit (HOSPITAL_COMMUNITY): Payer: Self-pay | Admitting: Internal Medicine

## 2020-09-02 ENCOUNTER — Other Ambulatory Visit (HOSPITAL_COMMUNITY): Payer: Self-pay

## 2020-09-03 ENCOUNTER — Ambulatory Visit (HOSPITAL_BASED_OUTPATIENT_CLINIC_OR_DEPARTMENT_OTHER): Payer: Medicaid Other | Admitting: Family Medicine

## 2020-09-03 ENCOUNTER — Encounter (HOSPITAL_COMMUNITY): Payer: Self-pay

## 2020-09-03 ENCOUNTER — Telehealth (HOSPITAL_COMMUNITY): Payer: Self-pay | Admitting: Licensed Clinical Social Worker

## 2020-09-03 NOTE — Telephone Encounter (Signed)
CSW attempted to call pt and pt emergency contact number to discuss no shows to recent appts.  Both numbers are not functioning at this time and unable to leave messages- have also emailed patient with no response.  CSW sent message through patient MyChart account to request return call.  Also sent message to clinic staff to inform of above and request if pt calls in to get new appt or request refill that they get updated contact information and try to keep him on the line to speak with me about barriers to attending his appts.  Will continue to follow and assist as needed  Burna Sis, LCSW Clinical Social Worker [facility] Desk#: [phone] Cell#: [phone]

## 2020-09-04 ENCOUNTER — Telehealth (HOSPITAL_COMMUNITY): Payer: Self-pay | Admitting: Cardiology

## 2020-09-04 ENCOUNTER — Other Ambulatory Visit (HOSPITAL_COMMUNITY): Payer: Self-pay | Admitting: Internal Medicine

## 2020-09-04 ENCOUNTER — Telehealth (HOSPITAL_COMMUNITY): Payer: Self-pay | Admitting: Licensed Clinical Social Worker

## 2020-09-04 MED ORDER — DIGOXIN 125 MCG PO TABS: 0.1250 mg | ORAL_TABLET | Freq: Every day | ORAL | 0 refills | Status: DC

## 2020-09-04 MED ORDER — POTASSIUM CHLORIDE CRYS ER 20 MEQ PO TBCR: 40.0000 meq | EXTENDED_RELEASE_TABLET | Freq: Two times a day (BID) | ORAL | 0 refills | Status: DC

## 2020-09-04 NOTE — Telephone Encounter (Signed)
Pt called into clinic to schedule appt and requested to speak with CSW regarding mental health concerns- was transferred back to CSW but his phone call was dropped.    CSW attempted to call pt back on new number that we obtained but pt did now answer- CSW left VM requesting return call.  Will continue to follow and assist as needed  Burna Sis, LCSW Clinical Social Worker [facility] Desk#: [phone] Cell#: [phone]

## 2020-09-04 NOTE — Telephone Encounter (Addendum)
Pt returned call  Reports he is in need of medication refills Advised meds have all been refilled enough till he is able to return for office visit  Pt c/o difficulty breathing at time 160 lbs at home Decreased UOP Feels bloated and nauseous all the time Reports compliance with medication  Please advise  Follow up 5/4    Per Amy Clegg,NP Will need add on appt to evaluate further 4/7 @ 1030  Attempted to contact pt [facility]

## 2020-09-04 NOTE — Telephone Encounter (Signed)
CSW received message from clinic staff that refill request had come in from pt [place] pharmacy in [place].  CSW called [facility] and they were able to provide two new numbers that they have saved on patient profile:  [phone]: went straight to VM- unable to leave message because its full [phone]: left message requesting return call  Will continue to attempt to make contact to discuss pt no shows and continuing to be seen in our clinic.  Burna Sis, LCSW Clinical Social Worker [facility] Desk#: [phone] Cell#: [phone]

## 2020-09-06 ENCOUNTER — Telehealth (HOSPITAL_COMMUNITY): Payer: Self-pay | Admitting: Licensed Clinical Social Worker

## 2020-09-06 NOTE — Telephone Encounter (Signed)
CSW attempted to call pt again to discuss mental health concerns- goes straight to VM- left message requesting return call  Burna Sis, LCSW Clinical Social Worker [facility] Desk#: [phone] Cell#: [phone]

## 2020-09-09 ENCOUNTER — Telehealth (HOSPITAL_COMMUNITY): Payer: Self-pay | Admitting: Licensed Clinical Social Worker

## 2020-09-09 NOTE — Progress Notes (Signed)
Heart and Vascular Care Navigation  09/09/2020  Paul Mccall 1970/10/23 [id number]  Reason for Referral: mental health concerns and frequent no-shows to appts leading to potential DC from clinic   Engaged with patient by telephone for initial visit for Heart and Vascular Care Coordination.                                                                                                   Assessment:  CSW called pt to follow up regarding pt self expressed mental health concerns and frequent no shows to appts.  Pt states that he has been very angry recently leading him to act rashly and leading to problems in his relationship even his live in girlfriend consulting with a Domestic Violence agency.  If he isn't angry he states he is depressed and crying and unable to get himself out of bed.  Reports he quit cocaine 1.5 years ago but now his thinking is different.  Reports no major history with mental health providers- only reported interaction was a couple of weeks ago with [place] who prescribed him medication which he states was ineffective.  Pt is agreeable to be set up with another mental health provider to try and assist with his anger and depressive symptoms.  CSW then spoke with pt about frequent no-shows.  Explained that we would not be able to continue providing refills for pt medications without completing a visit with him and that inability to come to appt on 5/3 could lead to his discharge from the clinic.  Pt expressed understanding- states that he hasn't come to previous appts because of not feeling well- CSW reminded pt this is why its important for him to come so we can assess whats going on and what needs to be adjusted to help him feel better moving forward.                                       HRT/VAS Care Coordination    Outpatient Care Team Social Worker   Social Worker Name: Rosetta Posner- Advanced [facility] Clinic- [phone]   Lives with: Significant Other    Patient Current Insurance Coverage Medicaid   Patient Has Concern With Paying Medical Bills No   Does Patient Have Prescription Coverage? Yes   DME Agency NA   HH Agency NA      Social History:                                                                             SDOH Screenings   Alcohol Screen: Not on file  Depression (VZC5-8): Not on file  Financial Resource Strain: Not on file  Food Insecurity: Not on file  Housing: Not on file  Physical Activity: Not on file  Social Connections: Not on file  Stress: Not on file  Tobacco Use: High Risk  . Smoking Tobacco Use: Current Every Day Smoker  . Smokeless Tobacco Use: Never Used  Transportation Needs: No Transportation Needs  . Lack of Transportation (Medical): No  . Lack of Transportation (Non-Medical): No    SDOH Interventions: Financial Resources:   none reported  Food Insecurity:   none reported  Housing Insecurity:   none reported  Transportation:   Transportation Interventions: Intervention Not Indicated    Other Care Navigation Interventions:     Inpatient/Outpatient Substance Abuse Counseling/Rehab Options Pt reports quitting 1.5 years ago expresses no concerns with substance abuse at this time  Provided Pharmacy assistance resources  n/a  Patient expressed Mental Health concerns Yes, Referred to:  [facility]   Follow-up plan:    Pt planning to go to walk in hours at [facility] on Friday after he gets back from out of town.  CSW to check in with him to help him remember to go.  CSW placed reminder to contact pt prior to 5/3 appt to confirm he is coming and assist with any barriers.  Will continue to follow and assist as needed  Burna Sis, LCSW Clinical Social Worker [facility] Desk#: [phone] Cell#: [phone]

## 2020-09-13 ENCOUNTER — Telehealth (HOSPITAL_COMMUNITY): Payer: Self-pay | Admitting: Licensed Clinical Social Worker

## 2020-09-13 NOTE — Telephone Encounter (Signed)
CSW attempted to call pt to remind he had planned to go to walk in hours at [facility]- phone went straight to error message.  Will plan to follow up with pt next week.  Will continue to follow and assist as needed  Burna Sis, LCSW Clinical Social Worker [facility] Desk#: [phone] Cell#: [phone]

## 2020-09-19 ENCOUNTER — Other Ambulatory Visit (HOSPITAL_COMMUNITY): Payer: Self-pay | Admitting: Internal Medicine

## 2020-09-20 ENCOUNTER — Other Ambulatory Visit (HOSPITAL_COMMUNITY): Payer: Self-pay

## 2020-09-20 MED ORDER — POTASSIUM CHLORIDE CRYS ER 20 MEQ PO TBCR: 40.0000 meq | EXTENDED_RELEASE_TABLET | Freq: Two times a day (BID) | ORAL | 0 refills | Status: DC

## 2020-10-03 ENCOUNTER — Telehealth (HOSPITAL_COMMUNITY): Payer: Self-pay | Admitting: Licensed Clinical Social Worker

## 2020-10-03 NOTE — Telephone Encounter (Signed)
CSW received call back from pt significant other who states pt has new phone number- CSW updated phone number in the chart.  Ms. Marthenia Rolling reports that pt is out of town right now but they are still aware of appt for next week an planning on coming.  Ms. Marthenia Rolling reports she is worried that pt will change his mind about coming because he gets scared to discuss his health and find out negative things (has been told before he was going to die within 6 months if he continued to use cocaine).  CSW provided support for Ms. Echols and guidance on how to discuss this with pt- that our goal is to help prevent pt from having an acute event by managing his illness and preventing further progression.  Pt did make appt with [facility] like discussed with pt but missed appt per Ms. Echols- encouraged her to help him schedule follow up again as pt still struggling with mental health concerns.  CSW will plan to reach out to pt directly on Monday to remind of appt and to help cope with any anxiety he has around upcoming appt.  Will continue to follow and assist as needed  Burna Sis, LCSW Clinical Social Worker [facility] Desk#: [phone] Cell#: [phone]

## 2020-10-03 NOTE — Telephone Encounter (Signed)
CSW called pt to remind of appt in clinic Tuesday and assess for barriers to attending- phone went to error message- unable to leave VM  Called pt emergency contact and left VM requesting return call  Will continue to follow and assist as needed  Burna Sis, LCSW Clinical Social Worker [facility] Desk#: [phone] Cell#: [phone]

## 2020-10-04 ENCOUNTER — Telehealth (HOSPITAL_COMMUNITY): Payer: Self-pay | Admitting: Adult Health

## 2020-10-04 MED ORDER — TORSEMIDE 20 MG PO TABS: 80.0000 mg | ORAL_TABLET | Freq: Two times a day (BID) | ORAL | 0 refills | Status: DC

## 2020-10-04 NOTE — Telephone Encounter (Signed)
Pt request Lasix medicaton refill, pt is out of meds, please send script to [facility] [place], pt can be reached @alternative  [phone]. Thanks

## 2020-10-07 ENCOUNTER — Telehealth (HOSPITAL_COMMUNITY): Payer: Self-pay | Admitting: Licensed Clinical Social Worker

## 2020-10-07 NOTE — Telephone Encounter (Signed)
CSW called pt and pt significant other to remind of clinic appt tomorrow at 12pm.  Unable to reach- left VM requesting return call  Will continue to follow and assist as needed  Burna Sis, LCSW Clinical Social Worker [facility] Desk#: [phone] Cell#: [phone]

## 2020-10-07 NOTE — Progress Notes (Signed)
Advanced Heart Failure Clinic Note    PCP: Patient, No Pcp Per (Inactive) PCP-Cardiologist: [name]/Dr. [name]   HPI: Paul Mccall is a 50 y.o. male with a history of chronic systolic heart failure due to NICM, HTN, bipolar disorder, h/o cocaine abuse, tobacco abuse, and medication noncompliance.   He was seen in [facility] 7 times in 2019 with CP +/- volume overload associated with medication noncompliance and ongoing cocaine use. Typically given IV lasix with improvement.   Seen in clinic by his cardiologist [name] at [facility] 01/2018. He was given IV lasix in clinic with 15 lb weight gain. Declined admission. He had been taking lasix incorrectly. He did not go to follow up. Weight was 194 lbs at that time.   He was admitted to [facility] 06/01/18-06/09/18 with A/C volume overload. He had not been taking medications due to confusion and cost. He had used cocaine within the week. EKG showed new LBBB at that time. Troponin was negative. Thought to be secondary to worsening CM and not ischemia. Diuresed with IV lasix. Advanced HF team consulted. Added lisinopril and spiro to his regimen. RHC was completed (see below). He was discharged on lasix 40 mg BID. DC weight 156 lbs. He was also treated for tooth abscess with amoxicillin.   Hospitalized Jan 2020 for acute on chronic systolic HF w/ low output and required milrinone. EF 15-20%, dilated CM, anteroseptal, inferoseptal, and inferior akinesis. Otherwise severe global hypokinesis, grade 3 DD, 2.6 cm mobile thrombus at apical lateral myocardium, severely dilated LA, mildly dilated RA, moderate MR, mild calcification of aortic valve. Started on coumadin w/ heparin bridge. Was weaned off milrinone w/ marginal co-ox ~64% but not felt to be a candidate for home inotrope's nor advanced therapies due to drug abuse. He was placed on GDMT and discharged from [facility] but failed to f/u in the [facility]. He has however been followed by [facility] and sees Dr.  [name].  Since his last admit at [facility] in 07/2018, he was also incarcerated x 4 months and several of his meds were changed. Since being released from prison, he denies any further cocaine use.   Per Care Everywhere, he was recently admitted to [facility] 4/21 for a/c CHF exacerbation. They started him on IV Lasix but he left AMA after 3 days. Reports he wanted to come back to [facility] for a second opinion and further care.  Per review of records, his UDS at [facility] was + only for [facility]. Negative for cocaine. 2D Echo showed LVEF 15%. RV systolic function mildly reduced.   Last seen in HF clinic back in 10/2019. He has No Showed his last 5 appointments.    Today he returns for HF follow up with his girl friend. Overall feeling fair. Having some shortness of breath with exertion.  Denies PMD/Orthopnea. Appetite ok. Eating fast food and continues to use salt.  No fever or chills. Weight at home 155-160 pounds.  Taking some meds but not taking potassium (hard time swallowing), asa, and xarelto. Missed a few dose of torsemide. Smoking 1/2 pack per day. Working part time as a Music therapist.  Lives with his girlfriend.   SH: Living in [place] w/ his Girlfriend. Has Medicaid. Incarcerated x 4 months in 2020. Prior poly subtance abuse but has been cocaine free x 17  months. Continues to smoke cigarettes, 1/2 ppd. Using THC gummies.  FH: His father had an MI at 44 years old and has CHF. No other family hx of CAD,  CHF, or SCD.  Cardiac Studies  RHC 06/09/18: Hemodynamics (mmHg) RA mean 2 RV 39/7 PA 37/14 (24) PCWP 14 Cardiac Output (Fick) 4.1 Cardiac Index (Fick) 2.24 PVR (MAP-CVP/CO) x 80: 195 SVR 1376  LHC 12/14/2016: no angiographic evidence of significant coaronary disease  Echo 08/2016: EF 35%, normal RV Echo 09/2017: EF 30-35%, mild MR, normal RV Echo 09/2019 ([name]): EF 15%. RV systolic function mildly reduce     Past Medical History:  Diagnosis Date  . CHF (congestive heart failure) (HCC)   .  Dental infection   . Enlarged heart   . Hernia, inguinal, right   . Hypertension     Current Outpatient Medications  Medication Sig Dispense Refill  . atorvastatin (LIPITOR) 20 MG tablet Take 1 tablet (20 mg total) by mouth daily. 30 tablet 0  . digoxin (LANOXIN) 0.125 MG tablet Take 1 tablet (0.125 mg total) by mouth daily. Please call [phone] for office visit 15 tablet 0  . Lumateperone Tosylate (CAPLYTA) 42 MG CAPS Take by mouth.    . naproxen sodium (ALEVE) 220 MG tablet Take 220 mg by mouth daily as needed.    . sacubitril-valsartan (ENTRESTO) 24-26 MG Take 1 tablet by mouth 2 (two) times daily. Must keep pending appointment for further refills 45 tablet 0  . spironolactone (ALDACTONE) 25 MG tablet Take 1 tablet (25 mg total) by mouth at bedtime. 30 tablet 0  . torsemide (DEMADEX) 20 MG tablet Take 4 tablets (80 mg total) by mouth 2 (two) times daily. 120 tablet 0  . aspirin EC 81 MG tablet Take 81 mg by mouth daily. (Patient not taking: Reported on 10/08/2020)    . hydrOXYzine (ATARAX/VISTARIL) 25 MG tablet Take 25 mg by mouth 3 (three) times daily as needed. (Patient not taking: Reported on 10/08/2020)    . potassium chloride SA (KLOR-CON) 20 MEQ tablet Take 2 tablets (40 mEq total) by mouth 2 (two) times daily. Must keep pending appt for further refills (Patient not taking: Reported on 10/08/2020) 90 tablet 0   No current facility-administered medications for this encounter.    No Known Allergies    Social History   Socioeconomic History  . Marital status: Significant Other    Spouse name: Not on file  . Number of children: 2  . Years of education: Not on file  . Highest education level: Not on file  Occupational History  . Not on file  Tobacco Use  . Smoking status: Current Every Day Smoker  . Smokeless tobacco: Never Used  Substance and Sexual Activity  . Alcohol use: Yes    Comment: occasionally  . Drug use: Not on file  . Sexual activity: Not on file  Other  Topics Concern  . Not on file  Social History Narrative   07/19/18- Pt transportation issues due to inability to afford gas- has car but does not have job- disability application to be completed with [employer].  Pt also reported food insecurity- food resources provided.   Social Determinants of Health   Financial Resource Strain: Not on file  Food Insecurity: Not on file  Transportation Needs: No Transportation Needs  . Lack of Transportation (Medical): No  . Lack of Transportation (Non-Medical): No  Physical Activity: Not on file  Stress: Not on file  Social Connections: Not on file  Intimate Partner Violence: Not on file      Family History  Problem Relation Age of Onset  . CAD Father   . Heart failure Father  Vitals:   10/08/20 1230  BP: 110/78  Pulse: 83  SpO2: 98%  Weight: 72.8 kg (160 lb 6.4 oz)    Wt Readings from Last 3 Encounters:  10/08/20 72.8 kg (160 lb 6.4 oz)  10/16/19 87.1 kg (192 lb)  07/19/18 69.3 kg (152 lb 12.8 oz)    Reds Clip 41%.   PHYSICAL EXAM: General:  Walked in the clinic. No resp difficulty HEENT: normal Neck: supple. JVP 9-10 . Carotids 2+ bilat; no bruits. No lymphadenopathy or thryomegaly appreciated. Cor: PMI nondisplaced. Regular rate & rhythm. No rubs, gallops or murmurs. Lungs: clear Abdomen: soft, nontender, nondistended. No hepatosplenomegaly. No bruits or masses. Good bowel sounds. Extremities: no cyanosis, clubbing, rash, edema Neuro: alert & orientedx3, cranial nerves grossly intact. moves all 4 extremities w/o difficulty. Affect pleasant   ASSESSMENT & PLAN:  1. Chronic Systolic HF - Chronic systolic HFdue to NICM from cocaine abuse.  LHC 2018 no significant coronary disease. Echo 09/2017: EF 30-35%. Echo 2/20: EF 15-20%. RHC (06/09/18) with CO 4.1, CI 2.24. Echo 07/14/18: EF 15-20%, dilated CM, anteroseptal, inferoseptal, and inferior akinesis. Otherwise severe global hypokinesis, grade 3 DD, 2.6 cm mobile thrombus at  apical lateral myocardium, severely dilated LA, mildly dilated RA, moderate MR, mild calcification of aortic valve. RV mild to moderate HK.  Echo repeated at [facility] 09/2019: EF 15%. RV systolic function mildly reduced - NYHA III . Reds CLip 41%. Check clip next visit.  - Volume status elevated. Continue torsemide 80 mg bid. Not taking 20 meq K due to size of tablet. Change to 10 meq K --> for total of 40 meq twice a day.  - Hold off on bb  - Add jardiance 10 mg daily   - Continue Entresto 24-26 mg bid -Continue spiro 25 mg daily.  -Digoxin 0.125 mg daily , check dig level - Check BMET  - ECHO at next visit.  -- Now that he has been cocaine free x 17 months w/ recent negative UDS, can consider w/u for more advanced therapies. Not a candidate for transplant given ongoing tobacco abuse. May be a candidate for LVAD.   2. Prior h/o cocaine use - reports he has not used in 17 months - UDS at [facility] 09/2019 negative (see in Care Everywhere)  3. Tobacco Abuse - Discussed smoking cessation.   4. H/o LV Thrombus: - noted on echo 07/2018 -He is not on an anticoagulant ? Stopped during prison.  -Echo repeated at [facility] 3/21 and not visualized -Had been on coumadin but stopped and was placed on xarelto. Sounds like xarelto was stopped in prison.  - Restart Xarelto given persistently low EF 15%  5. Social: Lives with his girlfriend . Having ongoing anxiety. Refer to SW for assistance with PCP   Discussed purpose of new med and plan for the next visit. Will check Reds Clip and BMET at that time.  Follow up in 2 weeks and 6-8 weeks with Dr [name] and an ECHO.    Tonye Becket, NP 10/08/20

## 2020-10-08 ENCOUNTER — Encounter (HOSPITAL_COMMUNITY): Payer: Self-pay

## 2020-10-08 ENCOUNTER — Ambulatory Visit (HOSPITAL_COMMUNITY)
Admission: RE | Admit: 2020-10-08 | Discharge: 2020-10-08 | Disposition: A | Discharge status: Home / Self Care | Payer: Medicaid Other | Source: Ambulatory Visit | Attending: Adult Health | Admitting: Adult Health

## 2020-10-08 ENCOUNTER — Other Ambulatory Visit (HOSPITAL_COMMUNITY): Payer: Self-pay

## 2020-10-08 ENCOUNTER — Other Ambulatory Visit: Payer: Self-pay

## 2020-10-08 ENCOUNTER — Telehealth (HOSPITAL_COMMUNITY): Payer: Self-pay | Admitting: Pharmacy Technician

## 2020-10-08 VITALS — BP 110/78 | HR 83 | Wt 160.4 lb

## 2020-10-08 DIAGNOSIS — Z720 Tobacco use: Secondary | ICD-10-CM | POA: Diagnosis not present

## 2020-10-08 DIAGNOSIS — I5042 Chronic combined systolic (congestive) and diastolic (congestive) heart failure: Secondary | ICD-10-CM | POA: Diagnosis present

## 2020-10-08 DIAGNOSIS — F17210 Nicotine dependence, cigarettes, uncomplicated: Secondary | ICD-10-CM | POA: Diagnosis not present

## 2020-10-08 DIAGNOSIS — I110 Hypertensive heart disease with heart failure: Secondary | ICD-10-CM | POA: Diagnosis not present

## 2020-10-08 DIAGNOSIS — Z8249 Family history of ischemic heart disease and other diseases of the circulatory system: Secondary | ICD-10-CM | POA: Insufficient documentation

## 2020-10-08 DIAGNOSIS — I513 Intracardiac thrombosis, not elsewhere classified: Secondary | ICD-10-CM

## 2020-10-08 DIAGNOSIS — Z79899 Other long term (current) drug therapy: Secondary | ICD-10-CM | POA: Insufficient documentation

## 2020-10-08 DIAGNOSIS — Z7982 Long term (current) use of aspirin: Secondary | ICD-10-CM | POA: Diagnosis not present

## 2020-10-08 DIAGNOSIS — F1410 Cocaine abuse, uncomplicated: Secondary | ICD-10-CM | POA: Diagnosis not present

## 2020-10-08 DIAGNOSIS — I420 Dilated cardiomyopathy: Secondary | ICD-10-CM | POA: Insufficient documentation

## 2020-10-08 DIAGNOSIS — F1421 Cocaine dependence, in remission: Secondary | ICD-10-CM | POA: Insufficient documentation

## 2020-10-08 LAB — BASIC METABOLIC PANEL
Anion gap: 11 (ref 5–15)
BUN: 15 mg/dL (ref 6–20)
CO2: 27 mmol/L (ref 22–32)
Calcium: 9 mg/dL (ref 8.9–10.3)
Chloride: 97 mmol/L — ABNORMAL LOW (ref 98–111)
Creatinine, Ser: 1 mg/dL (ref 0.61–1.24)
GFR, Estimated: >60 mL/min (ref >60)
Glucose, Bld: 69 mg/dL — ABNORMAL LOW (ref 70–99)
Potassium: 3.2 mmol/L — ABNORMAL LOW (ref 3.5–5.1)
Sodium: 135 mmol/L (ref 135–145)

## 2020-10-08 LAB — BRAIN NATRIURETIC PEPTIDE: B Natriuretic Peptide: 446.5 pg/mL — ABNORMAL HIGH (ref 0.0–100.0)

## 2020-10-08 LAB — DIGOXIN LEVEL: Digoxin Level: 0.4 ng/mL — ABNORMAL LOW (ref 0.8–2.0)

## 2020-10-08 MED ORDER — POTASSIUM CHLORIDE CRYS ER 10 MEQ PO TBCR
40.0000 meq | EXTENDED_RELEASE_TABLET | Freq: Two times a day (BID) | ORAL | 3 refills | Status: DC
  Filled 2020-10-08: qty 240, 30d supply, fill #0

## 2020-10-08 MED ORDER — RIVAROXABAN 20 MG PO TABS: 20.0000 mg | ORAL_TABLET | Freq: Every day | ORAL | 3 refills | Status: DC

## 2020-10-08 MED ORDER — EMPAGLIFLOZIN 10 MG PO TABS: 10.0000 mg | ORAL_TABLET | Freq: Every day | ORAL | Status: DC

## 2020-10-08 MED ORDER — RIVAROXABAN 20 MG PO TABS
20.0000 mg | ORAL_TABLET | Freq: Every day | ORAL | 3 refills | Status: DC
  Filled 2020-10-08: qty 30, 30d supply, fill #0

## 2020-10-08 MED ORDER — EMPAGLIFLOZIN 10 MG PO TABS
10.0000 mg | ORAL_TABLET | Freq: Every day | ORAL | 3 refills | Status: DC
  Filled 2020-10-08: qty 30, 30d supply, fill #0

## 2020-10-08 MED ORDER — EMPAGLIFLOZIN 10 MG PO TABS: 10.0000 mg | ORAL_TABLET | Freq: Every day | ORAL | 3 refills | Status: DC

## 2020-10-08 MED ORDER — POTASSIUM CHLORIDE CRYS ER 10 MEQ PO TBCR
40.0000 meq | EXTENDED_RELEASE_TABLET | Freq: Two times a day (BID) | ORAL | 3 refills | Status: DC
Start: 2020-10-08 — End: 2020-10-21

## 2020-10-08 NOTE — Telephone Encounter (Signed)
Advanced Heart Failure Patient Advocate Encounter  Prior Authorization for [place] has been approved.    PA# [id number] Effective dates: 10/08/20 through 10/08/21  Archer Asa, CPhT

## 2020-10-08 NOTE — Telephone Encounter (Signed)
Patient Advocate Encounter   Received notification from [facility] Medicaid that prior authorization for Jardiance is required.   PA submitted on [employer] Confirmation [id number] W Status is pending   Will continue to follow.

## 2020-10-08 NOTE — Patient Instructions (Addendum)
Labs today --we will call with any abnormals  Please restart Xarelto 20 mg daily  Change Potassium to 10 meq tablets (smaller). Take 4 tablets to equal 40 meq twice daily.  Prescription sent to pharmacy Start Jardiance 10 mg daily

## 2020-10-18 ENCOUNTER — Telehealth (HOSPITAL_COMMUNITY): Payer: Self-pay | Admitting: Licensed Clinical Social Worker

## 2020-10-18 NOTE — Telephone Encounter (Signed)
CSW called pt to remind of Monday appt- unable to reach, left VM.  Then called pt emergency contact, Darel Hong, who answered and acknowledged that they were aware of appt and plan on attending.  Did express concerns with multiple social issues including trouble paying for gas.  CSW informed we could assist with gas expenses if they come to appt and discuss other current financial concerns at this time.  Put in appt note for pt to speak with me while he is here- Will continue to follow and assist as needed  Burna Sis, LCSW Clinical Social Worker Advanced Heart Failure Clinic Desk#: [phone] Cell#: [phone]

## 2020-10-21 ENCOUNTER — Other Ambulatory Visit (HOSPITAL_COMMUNITY): Payer: Self-pay

## 2020-10-21 ENCOUNTER — Ambulatory Visit (HOSPITAL_COMMUNITY)
Admission: RE | Admit: 2020-10-21 | Discharge: 2020-10-21 | Disposition: A | Discharge status: Home / Self Care | Payer: Medicaid Other | Source: Ambulatory Visit | Attending: Adult Health | Admitting: Adult Health

## 2020-10-21 ENCOUNTER — Other Ambulatory Visit: Payer: Self-pay

## 2020-10-21 ENCOUNTER — Telehealth (HOSPITAL_COMMUNITY): Payer: Self-pay | Admitting: Licensed Clinical Social Worker

## 2020-10-21 VITALS — BP 120/78 | HR 94 | Wt 164.0 lb

## 2020-10-21 DIAGNOSIS — Z91138 Patient's unintentional underdosing of medication regimen for other reason: Secondary | ICD-10-CM | POA: Diagnosis not present

## 2020-10-21 DIAGNOSIS — I5022 Chronic systolic (congestive) heart failure: Secondary | ICD-10-CM | POA: Insufficient documentation

## 2020-10-21 DIAGNOSIS — Z7984 Long term (current) use of oral hypoglycemic drugs: Secondary | ICD-10-CM | POA: Insufficient documentation

## 2020-10-21 DIAGNOSIS — T45516A Underdosing of anticoagulants, initial encounter: Secondary | ICD-10-CM | POA: Insufficient documentation

## 2020-10-21 DIAGNOSIS — I420 Dilated cardiomyopathy: Secondary | ICD-10-CM | POA: Insufficient documentation

## 2020-10-21 DIAGNOSIS — F17210 Nicotine dependence, cigarettes, uncomplicated: Secondary | ICD-10-CM | POA: Diagnosis not present

## 2020-10-21 DIAGNOSIS — Z7982 Long term (current) use of aspirin: Secondary | ICD-10-CM | POA: Insufficient documentation

## 2020-10-21 DIAGNOSIS — F1410 Cocaine abuse, uncomplicated: Secondary | ICD-10-CM

## 2020-10-21 DIAGNOSIS — Z79899 Other long term (current) drug therapy: Secondary | ICD-10-CM | POA: Insufficient documentation

## 2020-10-21 DIAGNOSIS — Z720 Tobacco use: Secondary | ICD-10-CM | POA: Diagnosis not present

## 2020-10-21 DIAGNOSIS — Z716 Tobacco abuse counseling: Secondary | ICD-10-CM | POA: Insufficient documentation

## 2020-10-21 DIAGNOSIS — I110 Hypertensive heart disease with heart failure: Secondary | ICD-10-CM | POA: Diagnosis present

## 2020-10-21 DIAGNOSIS — Z8249 Family history of ischemic heart disease and other diseases of the circulatory system: Secondary | ICD-10-CM | POA: Insufficient documentation

## 2020-10-21 DIAGNOSIS — F1411 Cocaine abuse, in remission: Secondary | ICD-10-CM | POA: Diagnosis not present

## 2020-10-21 DIAGNOSIS — F419 Anxiety disorder, unspecified: Secondary | ICD-10-CM | POA: Insufficient documentation

## 2020-10-21 DIAGNOSIS — Z86718 Personal history of other venous thrombosis and embolism: Secondary | ICD-10-CM | POA: Diagnosis not present

## 2020-10-21 LAB — BASIC METABOLIC PANEL
Anion gap: 9 (ref 5–15)
BUN: 15 mg/dL (ref 6–20)
CO2: 30 mmol/L (ref 22–32)
Calcium: 9.4 mg/dL (ref 8.9–10.3)
Chloride: 99 mmol/L (ref 98–111)
Creatinine, Ser: 1.14 mg/dL (ref 0.61–1.24)
GFR, Estimated: >60 mL/min (ref >60)
Glucose, Bld: 92 mg/dL (ref 70–99)
Potassium: 4.2 mmol/L (ref 3.5–5.1)
Sodium: 138 mmol/L (ref 135–145)

## 2020-10-21 MED ORDER — ATORVASTATIN CALCIUM 20 MG PO TABS
20.0000 mg | ORAL_TABLET | Freq: Every day | ORAL | 3 refills | Status: DC
  Filled 2020-10-21: qty 30, 30d supply, fill #0

## 2020-10-21 MED ORDER — SPIRONOLACTONE 25 MG PO TABS
25.0000 mg | ORAL_TABLET | Freq: Every day | ORAL | 3 refills | Status: DC
Start: 2020-10-21 — End: 2021-01-14
  Filled 2020-10-21: qty 30, 30d supply, fill #0

## 2020-10-21 MED ORDER — TORSEMIDE 20 MG PO TABS
80.0000 mg | ORAL_TABLET | Freq: Two times a day (BID) | ORAL | 3 refills | Status: DC
  Filled 2020-10-21: qty 240, 30d supply, fill #0

## 2020-10-21 MED ORDER — ENTRESTO 24-26 MG PO TABS
1.0000 | ORAL_TABLET | Freq: Two times a day (BID) | ORAL | 3 refills | Status: DC
  Filled 2020-10-21: qty 60, 30d supply, fill #0

## 2020-10-21 MED ORDER — POTASSIUM CHLORIDE CRYS ER 10 MEQ PO TBCR
40.0000 meq | EXTENDED_RELEASE_TABLET | Freq: Two times a day (BID) | ORAL | 3 refills | Status: DC
  Filled 2020-10-21: qty 240, 30d supply, fill #0

## 2020-10-21 MED ORDER — DIGOXIN 125 MCG PO TABS
0.1250 mg | ORAL_TABLET | Freq: Every day | ORAL | 3 refills | Status: DC
  Filled 2020-10-21: qty 15, 15d supply, fill #0

## 2020-10-21 MED ORDER — TORSEMIDE 20 MG PO TABS
100.0000 mg | ORAL_TABLET | Freq: Two times a day (BID) | ORAL | 3 refills | Status: DC
  Filled 2020-10-21: qty 300, 30d supply, fill #0

## 2020-10-21 MED ORDER — EMPAGLIFLOZIN 10 MG PO TABS
10.0000 mg | ORAL_TABLET | Freq: Every day | ORAL | 3 refills | Status: DC
  Filled 2020-10-21: qty 30, 30d supply, fill #0

## 2020-10-21 NOTE — Patient Instructions (Addendum)
INCREASE Torsemide to 100 mg (5 tabs) twice a day   Labs today We will only contact you if something comes back abnormal or we need to make some changes. Otherwise no news is good news!  Your physician recommends that you schedule a follow-up appointment in: 6 weeks with the pharmacy team  Do the following things EVERYDAY: 1) Weigh yourself in the morning before breakfast. Write it down and keep it in a log. 2) Take your medicines as prescribed 3) Eat low salt foods--Limit salt (sodium) to 2000 mg per day.  4) Stay as active as you can everyday 5) Limit all fluids for the day to less than 2 liters  At the Advanced Heart Failure Clinic, you and your health needs are our priority. As part of our continuing mission to provide you with exceptional heart care, we have created designated Provider Care Teams. These Care Teams include your primary Cardiologist (physician) and Advanced Practice Providers (APPs- Physician Assistants and Nurse Practitioners) who all work together to provide you with the care you need, when you need it.   You may see any of the following providers on your designated Care Team at your next follow up: Marland Kitchen Dr Arvilla Meres . Dr Marca Ancona . Dr Thornell Mule . Tonye Becket, NP . Robbie Lis, PA . Shanda Bumps [place],NP . Karle Plumber, PharmD   Please be sure to bring in all your medications bottles to every appointment.   If you have any questions or concerns before your next appointment please send [place] a message through [place] or call our office at [phone].    TO LEAVE A MESSAGE FOR THE NURSE SELECT OPTION 2, PLEASE LEAVE A MESSAGE INCLUDING: . YOUR NAME . DATE OF BIRTH . CALL BACK NUMBER . REASON FOR CALL**this is important as we prioritize the call backs  YOU WILL RECEIVE A CALL BACK THE SAME DAY AS LONG AS YOU CALL BEFORE 4:00 PM

## 2020-10-21 NOTE — Progress Notes (Signed)
Advanced Heart Failure Clinic Note    PCP: None  PCP-Cardiologist: [name]/Dr. [name]   HPI: Paul Mccall is a 50 y.o. male with a history of chronic systolic heart failure due to NICM, HTN, bipolar disorder, h/o cocaine abuse, tobacco abuse, and medication noncompliance.   He was seen in [facility] 7 times in 2019 with CP +/- volume overload associated with medication noncompliance and ongoing cocaine use. Typically given IV lasix with improvement.   Seen in clinic by his cardiologist [name] at [facility] 01/2018. He was given IV lasix in clinic with 15 lb weight gain. Declined admission. He had been taking lasix incorrectly. He did not go to follow up. Weight was 194 lbs at that time.   He was admitted to [facility] 06/01/18-06/09/18 with A/C volume overload. He had not been taking medications due to confusion and cost. He had used cocaine within the week. EKG showed new LBBB at that time. Troponin was negative. Thought to be secondary to worsening CM and not ischemia. Diuresed with IV lasix. Advanced HF team consulted. Added lisinopril and spiro to his regimen. RHC was completed (see below). He was discharged on lasix 40 mg BID. DC weight 156 lbs. He was also treated for tooth abscess with amoxicillin.   Hospitalized Jan 2020 for acute on chronic systolic HF w/ low output and required milrinone. EF 15-20%, dilated CM, anteroseptal, inferoseptal, and inferior akinesis. Otherwise severe global hypokinesis, grade 3 DD, 2.6 cm mobile thrombus at apical lateral myocardium, severely dilated LA, mildly dilated RA, moderate MR, mild calcification of aortic valve. Started on coumadin w/ heparin bridge. Was weaned off milrinone w/ marginal co-ox ~64% but not felt to be a candidate for home inotrope's nor advanced therapies due to drug abuse. He was placed on GDMT and discharged from [facility] but failed to f/u in the [facility]. He has however been followed by [facility] and sees Dr. [name].  Admitted to [facility] 07/2018, he was also incarcerated x 4 months and several of his meds were changed. Since being released from prison, he denies any further cocaine use.   Per Care Everywhere, he was recently admitted to [facility] 4/21 for a/c CHF exacerbation. They started him on IV Lasix but he left AMA after 3 days. Reports he wanted to come back to [facility] for a second opinion and further care.  Per review of records, his UDS at [facility] was + only for [facility]. Negative for cocaine. 2021 ECHO showed LVEF 15%. RV systolic function mildly reduced.   He was seen in the HF clinic 10/08/20. Started on jardiance.  few weeks ago in HF clinic and started jardiance.   Today he returns for HF follow up.Overall feeling fair. Complaining of fatigue. Stressed out paying for rent/food.  SOB with exertion. Denies PND/Orthopnea. Appetite ok. No fever or chills. Weight at home 159-160   pounds. Taking all medications but ran out of entresto last night. Smoking 3-4 cigarettes per day. Has not used cocaine.  Not able to work.  Lives with his girlfriend. Has not been going to [employer].   SH: Living in [place] w/ his Girlfriend. Has Medicaid. Incarcerated x 4 months in 2020. Prior poly subtance abuse but has been cocaine free since 03/2019. Continues to smoke cigarettes, 1/2 ppd. Using THC gummies.  FH: His father had an MI at 39 years old and has CHF. No other family hx of CAD, CHF, or SCD.  Cardiac Studies  RHC 06/09/18: Hemodynamics (mmHg) RA  mean 2 RV 39/7 PA 37/14 (24) PCWP 14 Cardiac Output (Fick) 4.1 Cardiac Index (Fick) 2.24 PVR (MAP-CVP/CO) x 80: 195 SVR 1376  LHC 12/14/2016: no angiographic evidence of significant coaronary disease  Echo 08/2016: EF 35%, normal RV Echo 09/2017: EF 30-35%, mild MR, normal RV Echo 09/2019 ([name]): EF 15%. RV systolic function mildly reduce     Past Medical History:  Diagnosis Date  . CHF (congestive heart failure) (HCC)   . Dental infection   . Enlarged heart   . Hernia,  inguinal, right   . Hypertension     Current Outpatient Medications  Medication Sig Dispense Refill  . atorvastatin (LIPITOR) 20 MG tablet Take 1 tablet (20 mg total) by mouth daily. 30 tablet 0  . digoxin (LANOXIN) 0.125 MG tablet Take 1 tablet (0.125 mg total) by mouth daily. Please call [phone] for office visit 15 tablet 0  . empagliflozin (JARDIANCE) 10 MG TABS tablet Take 1 tablet (10 mg total) by mouth daily before breakfast. 30 tablet 3  . naproxen sodium (ALEVE) 220 MG tablet Take 220 mg by mouth daily as needed.    . potassium chloride (KLOR-CON) 10 MEQ tablet Take 4 tablets (40 mEq total) by mouth 2 (two) times daily. Must keep pending appt for further refills 240 tablet 3  . rivaroxaban (XARELTO) 20 MG TABS tablet Take 1 tablet (20 mg total) by mouth daily with supper. 30 tablet 3  . sacubitril-valsartan (ENTRESTO) 24-26 MG Take 1 tablet by mouth 2 (two) times daily. Must keep pending appointment for further refills 45 tablet 0  . spironolactone (ALDACTONE) 25 MG tablet Take 1 tablet (25 mg total) by mouth at bedtime. 30 tablet 0  . torsemide (DEMADEX) 20 MG tablet Take 4 tablets (80 mg total) by mouth 2 (two) times daily. 120 tablet 0  . aspirin EC 81 MG tablet Take 81 mg by mouth daily. (Patient not taking: No sig reported)    . hydrOXYzine (ATARAX/VISTARIL) 25 MG tablet Take 25 mg by mouth 3 (three) times daily as needed. (Patient not taking: Reported on 10/21/2020)    . Lumateperone Tosylate (CAPLYTA) 42 MG CAPS Take by mouth. (Patient not taking: Reported on 10/21/2020)     No current facility-administered medications for this encounter.    No Known Allergies    Social History   Socioeconomic History  . Marital status: Significant Other    Spouse name: Not on file  . Number of children: 2  . Years of education: Not on file  . Highest education level: Not on file  Occupational History  . Not on file  Tobacco Use  . Smoking status: Current Every Day Smoker  .  Smokeless tobacco: Never Used  Substance and Sexual Activity  . Alcohol use: Yes    Comment: occasionally  . Drug use: Not on file  . Sexual activity: Not on file  Other Topics Concern  . Not on file  Social History Narrative   07/19/18- Pt transportation issues due to inability to afford gas- has car but does not have job- disability application to be completed with [employer].  Pt also reported food insecurity- food resources provided.   Social Determinants of Health   Financial Resource Strain: Not on file  Food Insecurity: Not on file  Transportation Needs: No Transportation Needs  . Lack of Transportation (Medical): No  . Lack of Transportation (Non-Medical): No  Physical Activity: Not on file  Stress: Not on file  Social Connections: Not on file  Intimate Partner Violence: Not on file      Family History  Problem Relation Age of Onset  . CAD Father   . Heart failure Father     Vitals:   10/21/20 1111  BP: 120/78  Pulse: 94  SpO2: 99%  Weight: 74.4 kg (164 lb)    Wt Readings from Last 3 Encounters:  10/21/20 74.4 kg (164 lb)  10/08/20 72.8 kg (160 lb 6.4 oz)  10/16/19 87.1 kg (192 lb)    Reds Clip  47%   PHYSICAL EXAM: General:  Walked in the clinic. No resp difficulty HEENT: normal Neck: supple. JVP difficult to assess. Carotids 2+ bilat; no bruits. No lymphadenopathy or thryomegaly appreciated. Cor: PMI nondisplaced. Regular rate & rhythm. No rubs, gallops or murmurs. Lungs: clear Abdomen: soft, nontender,distended. No hepatosplenomegaly. No bruits or masses. Good bowel sounds. Extremities: no cyanosis, clubbing, rash, edema Neuro: alert & orientedx3, cranial nerves grossly intact. moves all 4 extremities w/o difficulty. Affect pleasant   ASSESSMENT & PLAN:  1. Chronic Systolic HF - Chronic systolic HFdue to NICM from cocaine abuse.  LHC 2018 no significant coronary disease. Echo 09/2017: EF 30-35%. Echo 2/20: EF 15-20%. RHC (06/09/18) with CO 4.1,  CI 2.24. Echo 07/14/18: EF 15-20%, dilated CM, anteroseptal, inferoseptal, and inferior akinesis. Otherwise severe global hypokinesis, grade 3 DD, 2.6 cm mobile thrombus at apical lateral myocardium, severely dilated LA, mildly dilated RA, moderate MR, mild calcification of aortic valve. RV mild to moderate HK.  Echo repeated at [facility] 08/2019: EF 15%. RV systolic function mildly reduced - NYHA III . Reds 47%. Increase torsemide 100 mg twice a day.  - Hold off on bb with progound fatigue.  - Continue jardiance 10 mg daily   - Reorder entresto 24-26 mg bid.  -Continue spiro 25 mg daily.  -Digoxin 0.125 mg daily , dig level 0.4  -- Now that he has been cocaine free x 18 months w/ recent negative UDS, can consider w/u for more advanced therapies. Not a candidate for transplant given ongoing tobacco abuse. May be a candidate for LVAD.   2. Prior h/o cocaine use - Last used 03/2019 - UDS at [facility] 09/2019 negative (see in Care Everywhere)  3. Tobacco Abuse - Discussed smoking cessation.   4. H/o LV Thrombus: - noted on echo 07/2018 -He is not on an anticoagulant ? Stopped during prison.  -Echo repeated at [facility] 3/21 and not visualized -Had been on coumadin but stopped and was placed on xarelto. Sounds like xarelto was stopped in prison.  -Continue  Xarelto given persistently low EF 15%  5. Social: Lives with his girlfriend . Having ongoing anxiety. Refer to SW for assistance with PCP   Discussed diuretic change. May need CPX test.   Asked social work to help with PCP.  Greater than 50% of the (total minutes 25) visit spent in counseling/coordination of care regarding the above.    Follow up in 6 weeks with pharmacy  Follow up with Dr [name] and an ECHO August.    Tonye Becket, NP 10/21/20

## 2020-10-21 NOTE — Progress Notes (Signed)
CSW met with pt during clinic appt to check in.  Provided gas card to assist with gas expenses to get to appt.  Also followed up on mental health resources as he continues to report struggling with his mental health- pt provided with list of [place] options and encouraged to got to walk in hours at [facility] to have intake completed and get established with a counselor.  Pt fiance apparently has not been getting paid her full income due to issues at work and this has made paying bills a struggle.  CSW provided pt and fiance list of resources for [place] crisis assistance programs to follow up on to assist with bills.  Will continue to follow and assist as needed  Jorge Ny, [place] [facility] Desk#: [phone] Cell#: [phone]
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
## 2020-10-21 NOTE — Progress Notes (Signed)
ReDS Vest / Clip - 10/21/20 1100      ReDS Vest / Clip   Station Marker C    Ruler Value 24    ReDS Value Range High volume overload    ReDS Actual Value 47

## 2020-10-23 ENCOUNTER — Other Ambulatory Visit (HOSPITAL_COMMUNITY): Payer: Self-pay

## 2020-12-02 ENCOUNTER — Inpatient Hospital Stay (HOSPITAL_COMMUNITY): Admission: RE | Admit: 2020-12-02 | Payer: Medicaid Other | Source: Ambulatory Visit

## 2020-12-26 ENCOUNTER — Telehealth (HOSPITAL_COMMUNITY): Payer: Self-pay | Admitting: Pharmacy Technician

## 2020-12-26 ENCOUNTER — Other Ambulatory Visit (HOSPITAL_COMMUNITY): Payer: Self-pay

## 2020-12-26 NOTE — Telephone Encounter (Signed)
Advanced Heart Failure Patient Advocate Encounter  I received a message from the patient's significant other that he was having a medication issue and had been out for a few days.  I called and left her a message to call back. I am not sure what medication is the problem or what the problem is to resolve it.  Archer Asa, CPhT

## 2020-12-26 NOTE — Telephone Encounter (Addendum)
Advanced Heart Failure Patient Advocate Encounter   Received notification from Medicaid that prior authorization for Paul Mccall is required.   PA submitted on [employer] Confirmation [id number] W Status is pending   Will continue to follow.

## 2021-01-01 ENCOUNTER — Other Ambulatory Visit (HOSPITAL_COMMUNITY): Payer: Self-pay

## 2021-01-01 NOTE — Telephone Encounter (Signed)
Advanced Heart Failure Patient Advocate Encounter  Prior Authorization for Sherryll Burger has been approved.    PA# [id number] Effective dates: 01/01/21 through 12/27/21  Archer Asa, CPhT

## 2021-01-07 NOTE — Progress Notes (Signed)
Advanced Heart Failure Clinic Note    PCP: None  PCP-Cardiologist: [name]/Dr. [name]   HPI: Paul Mccall is a 50 y.o. male with chronic systolic HF due to NICM, HTN, bipolar disorder, h/o cocaine abuse, tobacco abuse, and medication noncompliance.    He was seen in [facility] 7 times in 2019 with CP +/- volume overload associated with medication noncompliance and ongoing cocaine use. Typically given IV lasix with improvement.    He was admitted to [facility] 12/19 with ADHF. He had not been taking medications due to confusion and cost. He had used cocaine within the week. EKG showed new LBBB at that time. Troponin was negative. Thought to be secondary to worsening CM and not ischemia.    Admitted 1/20 for ADHF w/ low output and required milrinone. EF 15-20%, grade 3 DD, 2.6 cm mobile thrombus at apical lateral myocardium, moderate MR. Started on coumadin w/ heparin bridge. Was weaned off milrinone w/ marginal co-ox ~64% but not felt to be a candidate for home inotrope's nor advanced therapies due to drug abuse. He was placed on GDMT and discharged from [facility] but failed to f/u in the [facility]. He has however been followed by [facility] and sees Dr. [name].  Admitted to [facility] 2/20, he was also incarcerated x 4 months and several of his meds were changed. Since being released from prison, he denies any further cocaine use.   Admitted to [facility] 4/21 for ADHF left AMA after 3 days. Reports he wanted to come back to [facility] for a second opinion and further care.  2021 ECHO showed LVEF 15%. RV systolic function mildly reduced.   He was seen in the HF clinic 10/08/20. Started on jardiance.  Torsemide increased to 100 BID.    Today he returns for HF follow up.  Complaining of fatigue.  Mild SOB at rest and moderate to severe with minimal exertion. Just getting out of the car is difficult. Having paroxysmal severe episodes of dyspnea with exertion happens every time with sex. Then gets diaphoretic  afterwards.  No fever or chills. Weight at home 159-160  pounds. Taking all medications except the xarelto. Smoking 10 cigarettes per day. Has not used cocaine since 2020.  Not able to work.  Lives with his fiance. Occasionally going to NA meetings.   SH: Living in [place] w/ his Girlfriend. Has Medicaid. Incarcerated x 4 months in 2020. Prior poly subtance abuse but has been cocaine free since 03/2019. Continues to smoke cigarettes, 1/2 ppd. Using THC gummies.  FH: His father had an MI at 22 years old and has CHF. No other family hx of CAD, CHF, or SCD.    Cardiac Studies   RHC 06/09/18: Hemodynamics (mmHg) RA mean 2 RV 39/7 PA 37/14 (24) PCWP 14 Cardiac Output (Fick) 4.1 Cardiac Index (Fick) 2.24 PVR (MAP-CVP/CO) x 80: 195 SVR 1376   LHC 12/14/2016: no angiographic evidence of significant coaronary disease   Echo 08/2016: EF 35%, normal RV  Echo 09/2017: EF 30-35%, mild MR, normal RV Echo 09/2019 ([name]): EF 15%. RV systolic function mildly reduce     Past Medical History:  Diagnosis Date   CHF (congestive heart failure) (HCC)    Dental infection    Enlarged heart    Hernia, inguinal, right    Hypertension     Current Outpatient Medications  Medication Sig Dispense Refill   atorvastatin (LIPITOR) 20 MG tablet Take 1 tablet (20 mg total) by mouth daily. 30 tablet 3  digoxin (LANOXIN) 0.125 MG tablet Take 1 tablet (0.125 mg total) by mouth daily. 15 tablet 3   empagliflozin (JARDIANCE) 10 MG TABS tablet Take 1 tablet (10 mg total) by mouth daily before breakfast. 30 tablet 3   naproxen sodium (ALEVE) 220 MG tablet Take 220 mg by mouth daily as needed.     potassium chloride (KLOR-CON) 10 MEQ tablet Take 4 tablets (40 mEq total) by mouth 2 (two) times daily. 240 tablet 3   sacubitril-valsartan (ENTRESTO) 24-26 MG Take 1 tablet by mouth 2 (two) times daily. 60 tablet 3   spironolactone (ALDACTONE) 25 MG tablet Take 1 tablet (25 mg total) by mouth at bedtime. 30 tablet 3    torsemide (DEMADEX) 20 MG tablet Take 5 tablets (100 mg total) by mouth 2 (two) times daily. 300 tablet 3   Lumateperone Tosylate (CAPLYTA) 42 MG CAPS Take by mouth. (Patient not taking: No sig reported)     No current facility-administered medications for this encounter.    No Known Allergies    Social History   Socioeconomic History   Marital status: Significant Other    Spouse name: Not on file   Number of children: 2   Years of education: Not on file   Highest education level: Not on file  Occupational History   Not on file  Tobacco Use   Smoking status: Every Day   Smokeless tobacco: Never  Substance and Sexual Activity   Alcohol use: Yes    Comment: occasionally   Drug use: Not on file   Sexual activity: Not on file  Other Topics Concern   Not on file  Social History Narrative   07/19/18- Pt transportation issues due to inability to afford gas- has car but does not have job- disability application to be completed with [employer].  Pt also reported food insecurity- food resources provided.   Social Determinants of Health   Financial Resource Strain: Not on file  Food Insecurity: Not on file  Transportation Needs: No Transportation Needs   Lack of Transportation (Medical): No   Lack of Transportation (Non-Medical): No  Physical Activity: Not on file  Stress: Not on file  Social Connections: Not on file  Intimate Partner Violence: Not on file      Family History  Problem Relation Age of Onset   CAD Father    Heart failure Father     Vitals:   01/08/21 1017  BP: 116/80  Pulse: 83  SpO2: 97%  Weight: 75 kg (165 lb 6.4 oz)     Wt Readings from Last 3 Encounters:  01/08/21 75 kg (165 lb 6.4 oz)  10/21/20 74.4 kg (164 lb)  10/08/20 72.8 kg (160 lb 6.4 oz)    Reds Clip  43%   PHYSICAL EXAM: General:  Walked in the clinic. No resp difficulty HEENT: normal Neck: supple. JVP 8-9 Carotids 2+ bilat; no bruits. No lymphadenopathy or thryomegaly  appreciated. Cor: PMI nondisplaced. Regular rate & rhythm. No rubs, gallops or murmurs. Lungs: faint rales in bases Abdomen: soft, nontender,distended. No hepatosplenomegaly. No bruits or masses. Good bowel sounds. Extremities: no cyanosis, clubbing, rash, edema Neuro: alert & orientedx3, cranial nerves grossly intact. moves all 4 extremities w/o difficulty. Affect pleasant   ASSESSMENT & PLAN:  1. Chronic Systolic HF - Chronic systolic HF due to NICM from cocaine abuse.  LHC 2018 no significant coronary disease. Echo 09/2017: EF 30-35%. Echo 2/20: EF 15-20%. RHC (06/09/18) with CO 4.1, CI 2.24. Echo 07/14/18: EF 15-20%, dilated  CM, anteroseptal, inferoseptal, and inferior akinesis. Otherwise severe global hypokinesis, grade 3 DD, 2.6 cm mobile thrombus at apical lateral myocardium, severely dilated LA, mildly dilated RA, moderate MR, mild calcification of aortic valve. RV mild to moderate HK.  Echo repeated at [facility] 08/2019: EF 15%. RV systolic function mildly reduced - Missed formal ECHO this am but bedside ECHO with EF ~25% dilated LV, LA, RV normal to maybe mildly reduced, dyssynchrony consistent with LBBB, collapsible IVC with inspiration - NYHA III-IV . Reds 43%. Continue torsemide 100 mg twice a day. Add metolazone twice weekly M and F with K18meq supplement.  He will take his first dose today - Hold off on bb with progound fatigue and severe LV dysfunction  - Continue jardiance 10 mg daily   - Continue entresto 24-26 mg bid.  - Continue spiro 25 mg daily.  - Continue Digoxin 0.125 mg daily - RHC to assess hemodynamics on Friday, this can be followed by CPX - Has wide LBBB QRS today and dyssynchrony on Bedside echo today would likely benefit from CRT-D  2. Prior h/o cocaine use - Last used 03/2019 - UDS at [facility] 09/2019 negative (see in Care Everywhere)  3. Tobacco Abuse - Discussed smoking cessation.   4. H/o LV Thrombus: -noted on echo 07/2018 -Echo repeated at [facility] 3/21 and not  visualized -Had been on coumadin but stopped and was placed on xarelto. Sounds like xarelto was stopped in prison.  -Has not taken xarelto for one month.  Refill xarelto but hold night before RHC.   5. Social: Lives with his girlfriend . Having ongoing anxiety. Refer to SW for assistance with PCP    Angelita Ingles, MD 01/08/21  Patient seen and examined with the above-signed Advanced Practice Provider and/or Housestaff. I personally reviewed laboratory data, imaging studies and relevant notes. I independently examined the patient and formulated the important aspects of the plan. I have edited the note to reflect any of my changes or salient points. I have personally discussed the plan with the patient and/or family.  He remains quite tenuous. NYHA IIIB symptoms. Echo at bedside EF 25%. ReDS 43% despite torsemide 100 bid.   General:  SOB at rest.  HEENT: normal Neck: supple. JVP 7. Carotids 2+ bilat; no bruits. No lymphadenopathy or thryomegaly appreciated. Cor: PMI nondisplaced. Regular rate & rhythm. No rubs, gallops or murmurs. Lungs: clear Abdomen: soft, nontender, + distended. No hepatosplenomegaly. No bruits or masses. Good bowel sounds. Extremities: no cyanosis, clubbing, rash, tr-1+ edema Neuro: alert & orientedx3, cranial nerves grossly intact. moves all 4 extremities w/o difficulty. Affect pleasant  As above, very tenuous with advanced HF symptoms. No improvement in EF with GDMT. Suspect possible LBBB CM. Volume elevated. Add metolazone for 2 days and plan RHC on Friday. If shock numbers will consider VAD otherwise would start with CRT-D.  Total time spent 45 minutes. Over half that time spent discussing above.   Arvilla Meres, MD  12:59 PM

## 2021-01-08 ENCOUNTER — Encounter (HOSPITAL_COMMUNITY): Payer: Self-pay | Admitting: Internal Medicine

## 2021-01-08 ENCOUNTER — Other Ambulatory Visit (HOSPITAL_COMMUNITY): Payer: Self-pay | Admitting: *Deleted

## 2021-01-08 ENCOUNTER — Ambulatory Visit (HOSPITAL_COMMUNITY): Admission: RE | Admit: 2021-01-08 | Payer: Medicaid Other | Source: Ambulatory Visit

## 2021-01-08 ENCOUNTER — Ambulatory Visit (HOSPITAL_COMMUNITY)
Admission: RE | Admit: 2021-01-08 | Discharge: 2021-01-08 | Disposition: A | Discharge status: Home / Self Care | Payer: Medicaid Other | Source: Ambulatory Visit | Attending: Internal Medicine | Admitting: Internal Medicine

## 2021-01-08 ENCOUNTER — Other Ambulatory Visit: Payer: Self-pay

## 2021-01-08 VITALS — BP 116/80 | HR 83 | Wt 165.4 lb

## 2021-01-08 DIAGNOSIS — Z7984 Long term (current) use of oral hypoglycemic drugs: Secondary | ICD-10-CM | POA: Diagnosis not present

## 2021-01-08 DIAGNOSIS — Z9114 Patient's other noncompliance with medication regimen: Secondary | ICD-10-CM | POA: Insufficient documentation

## 2021-01-08 DIAGNOSIS — F1411 Cocaine abuse, in remission: Secondary | ICD-10-CM | POA: Insufficient documentation

## 2021-01-08 DIAGNOSIS — I110 Hypertensive heart disease with heart failure: Secondary | ICD-10-CM | POA: Insufficient documentation

## 2021-01-08 DIAGNOSIS — I447 Left bundle-branch block, unspecified: Secondary | ICD-10-CM | POA: Insufficient documentation

## 2021-01-08 DIAGNOSIS — F419 Anxiety disorder, unspecified: Secondary | ICD-10-CM | POA: Diagnosis not present

## 2021-01-08 DIAGNOSIS — F17210 Nicotine dependence, cigarettes, uncomplicated: Secondary | ICD-10-CM | POA: Insufficient documentation

## 2021-01-08 DIAGNOSIS — Z8249 Family history of ischemic heart disease and other diseases of the circulatory system: Secondary | ICD-10-CM | POA: Insufficient documentation

## 2021-01-08 DIAGNOSIS — F319 Bipolar disorder, unspecified: Secondary | ICD-10-CM | POA: Insufficient documentation

## 2021-01-08 DIAGNOSIS — Z91138 Patient's unintentional underdosing of medication regimen for other reason: Secondary | ICD-10-CM | POA: Insufficient documentation

## 2021-01-08 DIAGNOSIS — I5023 Acute on chronic systolic (congestive) heart failure: Secondary | ICD-10-CM

## 2021-01-08 DIAGNOSIS — I5022 Chronic systolic (congestive) heart failure: Secondary | ICD-10-CM | POA: Insufficient documentation

## 2021-01-08 DIAGNOSIS — Z79899 Other long term (current) drug therapy: Secondary | ICD-10-CM | POA: Insufficient documentation

## 2021-01-08 DIAGNOSIS — Z716 Tobacco abuse counseling: Secondary | ICD-10-CM | POA: Insufficient documentation

## 2021-01-08 DIAGNOSIS — T45516A Underdosing of anticoagulants, initial encounter: Secondary | ICD-10-CM | POA: Diagnosis not present

## 2021-01-08 DIAGNOSIS — I420 Dilated cardiomyopathy: Secondary | ICD-10-CM | POA: Diagnosis not present

## 2021-01-08 DIAGNOSIS — I080 Rheumatic disorders of both mitral and aortic valves: Secondary | ICD-10-CM | POA: Diagnosis not present

## 2021-01-08 DIAGNOSIS — Z86718 Personal history of other venous thrombosis and embolism: Secondary | ICD-10-CM | POA: Diagnosis not present

## 2021-01-08 LAB — BASIC METABOLIC PANEL
Anion gap: 9 (ref 5–15)
BUN: 14 mg/dL (ref 6–20)
CO2: 25 mmol/L (ref 22–32)
Calcium: 9.2 mg/dL (ref 8.9–10.3)
Chloride: 99 mmol/L (ref 98–111)
Creatinine, Ser: 0.96 mg/dL (ref 0.61–1.24)
GFR, Estimated: >60 mL/min (ref >60)
Glucose, Bld: 92 mg/dL (ref 70–99)
Potassium: 3.7 mmol/L (ref 3.5–5.1)
Sodium: 133 mmol/L — ABNORMAL LOW (ref 135–145)

## 2021-01-08 LAB — CBC
HCT: 41.9 % (ref 39.0–52.0)
Hemoglobin: 14.1 g/dL (ref 13.0–17.0)
MCH: 30.3 pg (ref 26.0–34.0)
MCHC: 33.7 g/dL (ref 30.0–36.0)
MCV: 89.9 fL (ref 80.0–100.0)
Platelets: 321 10*3/uL (ref 150–400)
RBC: 4.66 MIL/uL (ref 4.22–5.81)
RDW: 12.9 % (ref 11.5–15.5)
WBC: 12.1 10*3/uL — ABNORMAL HIGH (ref 4.0–10.5)
nRBC: 0 % (ref 0.0–0.2)

## 2021-01-08 MED ORDER — POTASSIUM CHLORIDE CRYS ER 10 MEQ PO TBCR
40.0000 meq | EXTENDED_RELEASE_TABLET | Freq: Two times a day (BID) | ORAL | 3 refills | Status: DC
Start: 2021-01-08 — End: 2021-07-01

## 2021-01-08 MED ORDER — METOLAZONE 2.5 MG PO TABS
2.5000 mg | ORAL_TABLET | ORAL | 3 refills | Status: DC
Start: 2021-01-09 — End: 2021-07-09

## 2021-01-08 MED ORDER — RIVAROXABAN 20 MG PO TABS: 20.0000 mg | ORAL_TABLET | Freq: Every day | ORAL | 6 refills | Status: AC

## 2021-01-08 NOTE — Patient Instructions (Signed)
Start Metolazone 2.5 mg every Monday and Wednesday STARTING TODAY  Take an extra 40 meq (4 tabs) of Potassium on every Monday and Wednesday with your Metolazone  Restart Xarelto  Heart Catheterization on Friday 8/5, see instructions below  Your physician recommends that you schedule a follow-up appointment in: 3-4 weeks  Do the following things EVERYDAY: Weigh yourself in the morning before breakfast. Write it down and keep it in a log. Take your medicines as prescribed Eat low salt foods--Limit salt (sodium) to 2000 mg per day.  Stay as active as you can everyday Limit all fluids for the day to less than 2 liters  Weigh yourself EVERY morning after you go to the bathroom but before you eat or drink anything. Write this number down in a weight log/diary. If you gain 3 pounds overnight or 5 pounds in a week, call the heart failure clinic  Restrict your sodium intake to less than 2000mg  per day. This will help prevent your body from holding onto fluid. Read food labels as a lot of canned and packaged foods have a lot of sodium.  Limit your fluid intake to less than 2 liters of fluid per day. Fluid includes all drinks, coffee, juice, ice chips, soup, jello, and all other liquids.  If you have any questions or concerns before your next appointment please send a message through [facility] or call our office at [phone].    TO LEAVE A MESSAGE FOR THE NURSE SELECT OPTION 2, PLEASE LEAVE A MESSAGE INCLUDING: YOUR NAME DATE OF BIRTH CALL BACK NUMBER REASON FOR CALL**this is important as we prioritize the call backs  YOU WILL RECEIVE A CALL BACK THE SAME DAY AS LONG AS YOU CALL BEFORE 4:00 PM  milAt the [facility] Clinic, you and your health needs are our priority. As part of our continuing mission to provide you with exceptional heart care, we have created designated Provider Care Teams. These Care Teams include your primary Cardiologist (physician) and Advanced Practice  Providers (APPs- Physician Assistants and Nurse Practitioners) who all work together to provide you with the care you need, when you need it.   You may see any of the following providers on your designated Care Team at your next follow up: Dr [phone] Dr Arvilla Meres Dr Marca Ancona, NP Brandon Melnick, Robbie Lis [place] Mikki Santee, PharmD   Please be sure to bring in all your medications bottles to every appointment.     CATHETERIZATION INSTRUCTIONS:  You are scheduled for a Cardiac Catheterization on Friday, August 5 with Dr. 09-20-1997.  1. Please arrive at the [facility] (Main Entrance A) at [facility]: [address] [place], [place] at 11:30 AM (This time is two hours before your procedure to ensure your preparation). Free valet parking service is available.   Special note: Every effort is made to have your procedure done on time. Please understand that emergencies sometimes delay scheduled procedures.  2. Diet: Do not eat solid foods after midnight.  The patient may have clear liquids until 5am upon the day of the procedure.  3. Labs: DONE TODAY  4. Medication instructions in preparation for your procedure:   Contrast Allergy: No   DO NOT TAKE XARELTO ON Thursday 8/4 PM  DO NOT TAKE JARDIANCE OR TORSEMIDE Friday 8/5 AM  On the morning of your procedure, take any morning medicines NOT listed above.  You may use sips of water.  5. Plan for  one night stay--bring personal belongings. 6. Bring a current list of your medications and current insurance cards. 7. You MUST have a responsible person to drive you home. 8. Someone MUST be with you the first 24 hours after you arrive home or your discharge will be delayed. 9. Please wear clothes that are easy to get on and off and wear slip-on shoes.  Thank you for allowing [place] to care for you!   -- [facility]

## 2021-01-08 NOTE — Progress Notes (Signed)
ReDS Vest / Clip - 01/08/21 1200       ReDS Vest / Clip   Station Marker C    Ruler Value 28    ReDS Value Range High volume overload    ReDS Actual Value 43

## 2021-01-09 ENCOUNTER — Telehealth (HOSPITAL_COMMUNITY): Payer: Self-pay | Admitting: Vascular Surgery

## 2021-01-09 NOTE — Telephone Encounter (Signed)
Pt called to cancel Cath 01/10/21 @ 1:30 PM, PLEASE call pt to resch

## 2021-01-10 ENCOUNTER — Encounter (HOSPITAL_COMMUNITY): Admission: RE | Payer: Self-pay | Source: Home / Self Care

## 2021-01-10 ENCOUNTER — Ambulatory Visit (HOSPITAL_COMMUNITY): Admission: RE | Admit: 2021-01-10 | Payer: Medicaid Other | Source: Home / Self Care | Admitting: Internal Medicine

## 2021-01-10 SURGERY — RIGHT HEART CATH
Anesthesia: LOCAL

## 2021-01-14 ENCOUNTER — Other Ambulatory Visit (HOSPITAL_COMMUNITY): Payer: Self-pay | Admitting: Cardiology

## 2021-01-14 MED ORDER — DIGOXIN 125 MCG PO TABS: 0.1250 mg | ORAL_TABLET | Freq: Every morning | ORAL | 3 refills | Status: DC

## 2021-01-14 MED ORDER — SPIRONOLACTONE 25 MG PO TABS: 25.0000 mg | ORAL_TABLET | Freq: Every morning | ORAL | 3 refills | Status: AC

## 2021-01-14 NOTE — Telephone Encounter (Signed)
RHC rescheduled Pt aware of instructions and voiced understanding       You are scheduled for a Cardiac Catheterization on Wednesday, August 17 with Dr. Arvilla Meres.  1. Please arrive at the [facility] (Main Entrance A) at [facility]: [address] [place], [place] [address] at 9:30 AM (This time is two hours before your procedure to ensure your preparation). Free valet parking service is available.   Special note: Every effort is made to have your procedure done on time. Please understand that emergencies sometimes delay scheduled procedures.  2. Diet: Do not eat solid foods after midnight.  The patient may have clear liquids until 5am upon the day of the procedure.  3. Labs: pre procedure labs done 01/09/21  4. Medication instructions in preparation for your procedure:   Contrast Allergy: No    Stop taking Xarelto (Rivaroxaban) on Tuesday, August 16.  Stop taking, Torsemide (Demadex) Wednesday, August 17, And hold Jardiacne one Wednesday August 17.     On the morning of your procedure,  any morning medicines NOT listed above.  You may use sips of water.  5. Plan for one night stay--bring personal belongings. 6. Bring a current list of your medications and current insurance cards. 7. You MUST have a responsible person to drive you home. 8. Someone MUST be with you the first 24 hours after you arrive home or your discharge will be delayed. 9. Please wear clothes that are easy to get on and off and wear slip-on shoes.  Thank you for allowing [place] to care for you!   -- [facility]

## 2021-01-22 ENCOUNTER — Encounter (HOSPITAL_COMMUNITY): Admission: RE | Payer: Self-pay | Source: Home / Self Care

## 2021-01-22 ENCOUNTER — Telehealth (HOSPITAL_COMMUNITY): Payer: Self-pay | Admitting: Internal Medicine

## 2021-01-22 ENCOUNTER — Ambulatory Visit (HOSPITAL_COMMUNITY): Admission: RE | Admit: 2021-01-22 | Payer: Medicaid Other | Source: Home / Self Care | Admitting: Internal Medicine

## 2021-01-22 SURGERY — RIGHT HEART CATH
Anesthesia: LOCAL

## 2021-01-22 NOTE — Telephone Encounter (Signed)
Pt canceled heart cath, please call to r/s. thanks

## 2021-01-27 ENCOUNTER — Ambulatory Visit: Payer: Self-pay

## 2021-01-27 NOTE — Telephone Encounter (Signed)
Patient called because of infected mosquito bites. Bites are large with a divot in the center. Bites are warm and patient thinks he may have a  fever. His girlfriend also has sores on her legs.   Patient does not have a PCP and will go to UC.       Reason for Disposition  [1] Red spreading area around 1 sore AND [2] fever  Answer Assessment - Initial Assessment Questions 1. APPEARANCE of IMPETIGO: "What does the rash look like?"      12 + infected mosquito bites.  2. LOCATION: "Where is the rash located?"      Arms, ankles and feet 3. NUMBER: "How many sores are there?"      12+ 4. SIZE: "How big is the largest sore?" (inches, cm or compare to size of a coin)     Quarter 5. ONSET: "When did the sores start?"     After scratching mosquito bites 6. OTHER SYMPTOMS: "Do you have any other symptoms?" (e.g., fever, pain)     May have a fever. 7. PREGNANCY: "Is there any chance you are pregnant?" "When was your last menstrual period?"     NA  Protocols used: Impetigo (Infected Sore)-A-AH

## 2021-01-30 NOTE — Telephone Encounter (Signed)
Called to confirm/remind patient of their appointment at the Advanced [facility] Clinic on 01/31/21. However, I was unable to reach the patient and a detailed message of annotation was left on his voice mail.  Patient reminded to bring all medications and/or complete list.  Confirmed patient has transportation. Gave directions, instructed to utilize valet parking.  Confirmed appointment prior to ending call.

## 2021-01-31 ENCOUNTER — Telehealth (HOSPITAL_COMMUNITY): Payer: Self-pay

## 2021-01-31 ENCOUNTER — Encounter (HOSPITAL_COMMUNITY): Payer: Medicaid Other

## 2021-01-31 NOTE — Telephone Encounter (Signed)
I tried to call patient and left a detailed voice message that he missed his appointment today in our clinic  provided our phone number for him to give our office a call back to get rescheduled.

## 2021-02-11 ENCOUNTER — Telehealth (HOSPITAL_COMMUNITY): Payer: Self-pay | Admitting: *Deleted

## 2021-02-11 NOTE — Telephone Encounter (Signed)
Pt left vm to r/s rhc. Returned call to pt RHC scheduled.

## 2021-02-12 ENCOUNTER — Other Ambulatory Visit (HOSPITAL_COMMUNITY): Payer: Self-pay

## 2021-02-12 DIAGNOSIS — I5022 Chronic systolic (congestive) heart failure: Secondary | ICD-10-CM

## 2021-02-17 ENCOUNTER — Telehealth (HOSPITAL_COMMUNITY): Payer: Self-pay | Admitting: Licensed Clinical Social Worker

## 2021-02-17 NOTE — Telephone Encounter (Signed)
CSW consulted to speak with pt regarding current housing concerns- CSW called pt to discuss- unable to reach- left VM requesting return call  Burna Sis, LCSW Clinical Social Worker [facility] Desk#: [phone] Cell#: [phone]

## 2021-02-17 NOTE — Progress Notes (Addendum)
Heart and Vascular Care Navigation  02/17/2021  Jamiere Gulas 1970/07/31 [id number]  Reason for Referral: Housing concerns- pt requesting call   Engaged with patient by telephone for follow up visit for Heart and Vascular Care Coordination.                                                                                                   Assessment:     Pt requesting call from CSW to discuss housing concerns.  Pt states that him and his fiance are being evicted from their current apt.  States that they were receiving assistance through the [employer] program with rent but that somehow that payment was lost and lead to them owing $5000 and getting evicted- they even offered to pay for this amount but were rejected.  Pt reports that they were supposed to be evicte on 02/11/21 but that someone said there was a 10 day foregiveness period so he doesn't think he has to leave until 02/21/21.  His fiance has already moved into a shelter but its only for women.  He has been trying to find another house/apt but states that they require credit checks and their credit is preventing them from being accepted to new living situation.  Has spoken to a friend who has offered to allow him to stay in his camper that is in the back of his house- encouraged him to consider this as an option vs shelter so that he doesn't have to sleep in his car or on the street as he is unlikely to find permanent housing by his eviction date.  CSW sent list of housing options within pt rental range ($1000) that do not require credit checks- sent [place] and [employer].  CSW also provided with list of shelters in case pt would prefer to go to shelter over his friends camper.  Pt also stated he is not 100% sure he can make it to his cath on Thursday due to possible transportation issues.  He thinks his friend will be driving him but hasn't confirmed this yet as his friend has to make it back into town though he has verbally committed  to take him.                                  HRT/VAS Care Coordination     Outpatient Care Team Social Worker   Social Worker Name: Rosetta Posner- [facility]- [phone]   Lives with: Significant Other   Patient Current Insurance Coverage Medicaid   Patient Has Concern With Paying Medical Bills No   Does Patient Have Prescription Coverage? Yes   DME Agency NA   HH Agency NA       Social History:  SDOH Screenings   Alcohol Screen: Not on file  Depression (PHQ2-9): Not on file  Financial Resource Strain: Not on file  Food Insecurity: Not on file  Housing: Medium Risk   Last Housing Risk Score: 1  Physical Activity: Not on file  Social Connections: Not on file  Stress: Not on file  Tobacco Use: High Risk   Smoking Tobacco Use: Every Day   Smokeless Tobacco Use: Never  Transportation Needs: No Transportation Needs   Lack of Transportation (Medical): No   Lack of Transportation (Non-Medical): No    SDOH Interventions: Financial Resources:    Pt reports no concerns with finances at this time  Food Insecurity:   None reported  Housing Insecurity:  Housing Interventions: Other (Comment) (housing list that don't require credit scores, shelter list)  Transportation:    Pt owns his own car   Follow-up plan:    CSW to check with pt Wednesday regarding ride to cath on Thursday- will assist with [facility] transport if need be  Pt to work on contacting housing options from list that CSW provided.  Will continue to follow and assist as needed  Burna Sis, LCSW Clinical Social Worker [facility] Desk#: [phone] Cell#: [phone]

## 2021-02-20 ENCOUNTER — Ambulatory Visit (HOSPITAL_COMMUNITY): Admission: RE | Admit: 2021-02-20 | Payer: Medicaid Other | Source: Home / Self Care | Admitting: Internal Medicine

## 2021-02-20 ENCOUNTER — Encounter (HOSPITAL_COMMUNITY): Admission: RE | Payer: Self-pay | Source: Home / Self Care

## 2021-02-20 SURGERY — RIGHT HEART CATH
Anesthesia: LOCAL

## 2021-03-10 ENCOUNTER — Telehealth (HOSPITAL_COMMUNITY): Payer: Self-pay | Admitting: Vascular Surgery

## 2021-03-10 NOTE — Telephone Encounter (Signed)
PT called to reschedule heart cath from 01/13/21 .Marland Kitchen please advise

## 2021-03-17 NOTE — Telephone Encounter (Signed)
Pt has cancelled RHC 3 times. We are unable to schedule at this time per Dinah Beers we will address at next office visit. Called pt to schedule office visit no answer/left vm for pt to return call.

## 2021-04-04 ENCOUNTER — Telehealth (HOSPITAL_COMMUNITY): Payer: Self-pay | Admitting: Vascular Surgery

## 2021-04-04 NOTE — Telephone Encounter (Signed)
PT called to resch heart cath please call [phone]

## 2021-04-10 ENCOUNTER — Encounter (HOSPITAL_COMMUNITY): Payer: Medicaid Other

## 2021-04-10 NOTE — Progress Notes (Incomplete)
Advanced Heart Failure Clinic Note    PCP: None  PCP-Cardiologist: [name]/Dr. [name]   HPI: Paul Mccall is a 50 y.o. male with chronic systolic HF due to NICM, HTN, bipolar disorder, h/o cocaine abuse, tobacco abuse, and medication noncompliance.    He was seen in [facility] 7 times in 2019 with CP +/- volume overload associated with medication noncompliance and ongoing cocaine use. Typically given IV lasix with improvement.    He was admitted to [facility] 12/19 with ADHF. He had not been taking medications due to confusion and cost. He had used cocaine within the week. EKG showed new LBBB at that time. Troponin was negative. Thought to be secondary to worsening CM and not ischemia.    Admitted 1/20 for ADHF w/ low output and required milrinone. EF 15-20%, grade 3 DD, 2.6 cm mobile thrombus at apical lateral myocardium, moderate MR. Started on coumadin w/ heparin bridge. Was weaned off milrinone w/ marginal co-ox ~64% but not felt to be a candidate for home inotrope's nor advanced therapies due to drug abuse. He was placed on GDMT and discharged from [facility] but failed to f/u in the [facility]. He has however been followed by [facility] Cardiology and sees Dr. [name].  Admitted to [facility] 2/20, he was also incarcerated x 4 months and several of his meds were changed. Since being released from prison, he denies any further cocaine use.   Admitted to [facility] 4/21 for ADHF left AMA after 3 days. Reports he wanted to come back to [facility] for a second opinion and further care.  2021 ECHO showed LVEF 15%. RV systolic function mildly reduced.   He was seen in the HF clinic 10/08/20. Started on jardiance.  Torsemide increased to 100 BID.    Today he returns for HF follow up.  Complaining of fatigue.  Mild SOB at rest and moderate to severe with minimal exertion. Just getting out of the car is difficult. Having paroxysmal severe episodes of dyspnea with exertion happens every time with sex. Then gets diaphoretic  afterwards.  No fever or chills. Weight at home 159-160  pounds. Taking all medications except the xarelto. Smoking 10 cigarettes per day. Has not used cocaine since 2020.  Not able to work.  Lives with his fiance. Occasionally going to NA meetings.   SH: Living in [place] w/ his Girlfriend. Has Medicaid. Incarcerated x 4 months in 2020. Prior poly subtance abuse but has been cocaine free since 03/2019. Continues to smoke cigarettes, 1/2 ppd. Using THC gummies.  FH: His father had an MI at 52 years old and has CHF. No other family hx of CAD, CHF, or SCD.    Cardiac Studies   RHC 06/09/18: Hemodynamics (mmHg) RA mean 2 RV 39/7 PA 37/14 (24) PCWP 14 Cardiac Output (Fick) 4.1 Cardiac Index (Fick) 2.24 PVR (MAP-CVP/CO) x 80: 195 SVR 1376   LHC 12/14/2016: no angiographic evidence of significant coaronary disease   Echo 08/2016: EF 35%, normal RV  Echo 09/2017: EF 30-35%, mild MR, normal RV Echo 09/2019 ([name]): EF 15%. RV systolic function mildly reduce     Past Medical History:  Diagnosis Date   CHF (congestive heart failure) (HCC)    Dental infection    Enlarged heart    Hernia, inguinal, right    Hypertension     Current Outpatient Medications  Medication Sig Dispense Refill   atorvastatin (LIPITOR) 20 MG tablet Take 1 tablet (20 mg total) by mouth daily. (Patient taking differently: Take 20  mg by mouth every evening.) 30 tablet 3   digoxin (LANOXIN) 0.125 MG tablet Take 1 tablet (0.125 mg total) by mouth in the morning. 30 tablet 3   empagliflozin (JARDIANCE) 10 MG TABS tablet Take 1 tablet (10 mg total) by mouth daily before breakfast. 30 tablet 3   ibuprofen (ADVIL) 200 MG tablet Take 400 mg by mouth daily as needed for headache.     metolazone (ZAROXOLYN) 2.5 MG tablet Take 1 tablet (2.5 mg total) by mouth 2 (two) times a week. Every Monday and Wednesday 10 tablet 3   naproxen sodium (ALEVE) 220 MG tablet Take 220 mg by mouth daily as needed (pain.).     potassium chloride  (KLOR-CON) 10 MEQ tablet Take 4 tablets (40 mEq total) by mouth 2 (two) times daily. Take extra 4 tabs on Monday and Wed with Metolazone 260 tablet 3   rivaroxaban (XARELTO) 20 MG TABS tablet Take 1 tablet (20 mg total) by mouth daily with supper. (Patient not taking: Reported on 02/17/2021) 30 tablet 6   sacubitril-valsartan (ENTRESTO) 24-26 MG Take 1 tablet by mouth 2 (two) times daily. 60 tablet 3   spironolactone (ALDACTONE) 25 MG tablet Take 1 tablet (25 mg total) by mouth in the morning. 30 tablet 3   torsemide (DEMADEX) 20 MG tablet Take 5 tablets (100 mg total) by mouth 2 (two) times daily. (Patient taking differently: Take 80 mg by mouth 2 (two) times daily.) 300 tablet 3   No current facility-administered medications for this visit.    Allergies  Allergen Reactions   Tape Itching and Rash    Adhesive tape      Social History   Socioeconomic History   Marital status: Significant Other    Spouse name: Not on file   Number of children: 2   Years of education: Not on file   Highest education level: Not on file  Occupational History   Not on file  Tobacco Use   Smoking status: Every Day   Smokeless tobacco: Never  Substance and Sexual Activity   Alcohol use: Yes    Comment: occasionally   Drug use: Not on file   Sexual activity: Not on file  Other Topics Concern   Not on file  Social History Narrative   07/19/18- Pt transportation issues due to inability to afford gas- has car but does not have job- disability application to be completed with [employer].  Pt also reported food insecurity- food resources provided.   Social Determinants of Health   Financial Resource Strain: Not on file  Food Insecurity: Not on file  Transportation Needs: No Transportation Needs   Lack of Transportation (Medical): No   Lack of Transportation (Non-Medical): No  Physical Activity: Not on file  Stress: Not on file  Social Connections: Not on file  Intimate Partner Violence: Not on  file      Family History  Problem Relation Age of Onset   CAD Father    Heart failure Father     There were no vitals filed for this visit.    Wt Readings from Last 3 Encounters:  01/08/21 75 kg (165 lb 6.4 oz)  10/21/20 74.4 kg (164 lb)  10/08/20 72.8 kg (160 lb 6.4 oz)    Reds Clip  43%   PHYSICAL EXAM: General:  Walked in the clinic. No resp difficulty HEENT: normal Neck: supple. JVP 8-9 Carotids 2+ bilat; no bruits. No lymphadenopathy or thryomegaly appreciated. Cor: PMI nondisplaced. Regular rate & rhythm. No  rubs, gallops or murmurs. Lungs: faint rales in bases Abdomen: soft, nontender,distended. No hepatosplenomegaly. No bruits or masses. Good bowel sounds. Extremities: no cyanosis, clubbing, rash, edema Neuro: alert & orientedx3, cranial nerves grossly intact. moves all 4 extremities w/o difficulty. Affect pleasant   ASSESSMENT & PLAN:  1. Chronic Systolic HF - Chronic systolic HF due to NICM from cocaine abuse.  [place] 2018 no significant coronary disease. Echo 09/2017: EF 30-35%. Echo 2/20: EF 15-20%. RHC (06/09/18) with CO 4.1, CI 2.24. Echo 07/14/18: EF 15-20%, dilated CM, anteroseptal, inferoseptal, and inferior akinesis. Otherwise severe global hypokinesis, grade 3 DD, 2.6 cm mobile thrombus at apical lateral myocardium, severely dilated LA, mildly dilated RA, moderate MR, mild calcification of aortic valve. RV mild to moderate HK.  Echo repeated at [facility] 08/2019: EF 15%. RV systolic function mildly reduced - Missed formal ECHO this am but bedside ECHO with EF ~25% dilated LV, LA, RV normal to maybe mildly reduced, dyssynchrony consistent with LBBB, collapsible IVC with inspiration - NYHA III-IV . Reds 43%. Continue torsemide 100 mg twice a day. Add metolazone twice weekly M and F with [id number] supplement.  He will take his first dose today - Hold off on bb with progound fatigue and severe LV dysfunction  - Continue jardiance 10 mg daily   - Continue entresto 24-26 mg bid.   - Continue spiro 25 mg daily.  - Continue Digoxin 0.125 mg daily - RHC to assess hemodynamics on Friday, this can be followed by CPX - Has wide LBBB QRS 115ms today and dyssynchrony on Bedside echo today would likely benefit from CRT-D  2. Prior h/o cocaine use - Last used 03/2019 - UDS at [facility] 09/2019 negative (see in [place])  3. Tobacco Abuse - Discussed smoking cessation.   4. H/o LV Thrombus: -noted on echo 07/2018 -Echo repeated at [facility] 3/21 and not visualized -Had been on coumadin but stopped and was placed on xarelto. Sounds like xarelto was stopped in prison.  -Has not taken xarelto for one month.  Refill xarelto but hold night before RHC.   5. Social: Lives with his girlfriend . Having ongoing anxiety. Refer to SW for assistance with PCP    Rafael Bihari, FNP 04/10/21  Patient seen and examined with the above-signed Advanced Practice Provider and/or Housestaff. I personally reviewed laboratory data, imaging studies and relevant notes. I independently examined the patient and formulated the important aspects of the plan. I have edited the note to reflect any of my changes or salient points. I have personally discussed the plan with the patient and/or family.  He remains quite tenuous. NYHA IIIB symptoms. Echo at bedside EF 25%. ReDS 43% despite torsemide 100 bid.   General:  SOB at rest.  HEENT: normal Neck: supple. JVP 7. Carotids 2+ bilat; no bruits. No lymphadenopathy or thryomegaly appreciated. Cor: PMI nondisplaced. Regular rate & rhythm. No rubs, gallops or murmurs. Lungs: clear Abdomen: soft, nontender, + distended. No hepatosplenomegaly. No bruits or masses. Good bowel sounds. Extremities: no cyanosis, clubbing, rash, tr-1+ edema Neuro: alert & orientedx3, cranial nerves grossly intact. moves all 4 extremities w/o difficulty. Affect pleasant  As above, very tenuous with advanced HF symptoms. No improvement in EF with GDMT. Suspect possible LBBB CM.  Volume elevated. Add metolazone for 2 days and plan RHC on Friday. If shock numbers will consider VAD otherwise would start with CRT-D.  Total time spent 45 minutes. Over half that time spent discussing above.   Rafael Bihari, FNP  8:04 AM

## 2021-04-16 ENCOUNTER — Telehealth (HOSPITAL_COMMUNITY): Payer: Self-pay

## 2021-04-16 ENCOUNTER — Other Ambulatory Visit (HOSPITAL_COMMUNITY): Payer: Self-pay | Admitting: *Deleted

## 2021-04-16 MED ORDER — TORSEMIDE 20 MG PO TABS: 100.0000 mg | ORAL_TABLET | Freq: Two times a day (BID) | ORAL | 0 refills | Status: DC

## 2021-04-16 NOTE — Telephone Encounter (Signed)
Called pt no answer. Refill sent.

## 2021-04-16 NOTE — Telephone Encounter (Signed)
Patient has an upcoming appointment but will need fluid pill prior to appointment.  Please contact patient.

## 2021-04-23 ENCOUNTER — Telehealth (HOSPITAL_COMMUNITY): Payer: Self-pay

## 2021-04-23 NOTE — Telephone Encounter (Signed)
Called to confirm/remind patient of their appointment at the Advanced Heart Failure Clinic on 04/24/21.   Patient reminded to bring all medications and/or complete list.  Confirmed patient has transportation. Gave directions, instructed to utilize valet parking.  Confirmed appointment prior to ending call.

## 2021-04-24 ENCOUNTER — Other Ambulatory Visit: Payer: Self-pay

## 2021-04-24 ENCOUNTER — Encounter (HOSPITAL_COMMUNITY): Payer: Self-pay

## 2021-04-24 ENCOUNTER — Ambulatory Visit (HOSPITAL_COMMUNITY)
Admission: RE | Admit: 2021-04-24 | Discharge: 2021-04-24 | Disposition: A | Discharge status: Home / Self Care | Payer: Medicaid Other | Source: Ambulatory Visit | Attending: Adult Health | Admitting: Adult Health

## 2021-04-24 VITALS — BP 102/70 | HR 90 | Wt 164.6 lb

## 2021-04-24 DIAGNOSIS — I447 Left bundle-branch block, unspecified: Secondary | ICD-10-CM

## 2021-04-24 DIAGNOSIS — F17210 Nicotine dependence, cigarettes, uncomplicated: Secondary | ICD-10-CM | POA: Diagnosis not present

## 2021-04-24 DIAGNOSIS — F1421 Cocaine dependence, in remission: Secondary | ICD-10-CM | POA: Diagnosis not present

## 2021-04-24 DIAGNOSIS — Z7901 Long term (current) use of anticoagulants: Secondary | ICD-10-CM | POA: Diagnosis not present

## 2021-04-24 DIAGNOSIS — Z720 Tobacco use: Secondary | ICD-10-CM

## 2021-04-24 DIAGNOSIS — F1410 Cocaine abuse, uncomplicated: Secondary | ICD-10-CM | POA: Diagnosis not present

## 2021-04-24 DIAGNOSIS — I110 Hypertensive heart disease with heart failure: Secondary | ICD-10-CM | POA: Insufficient documentation

## 2021-04-24 DIAGNOSIS — I5022 Chronic systolic (congestive) heart failure: Secondary | ICD-10-CM | POA: Diagnosis not present

## 2021-04-24 DIAGNOSIS — I513 Intracardiac thrombosis, not elsewhere classified: Secondary | ICD-10-CM

## 2021-04-24 DIAGNOSIS — I420 Dilated cardiomyopathy: Secondary | ICD-10-CM | POA: Insufficient documentation

## 2021-04-24 DIAGNOSIS — Z8249 Family history of ischemic heart disease and other diseases of the circulatory system: Secondary | ICD-10-CM | POA: Insufficient documentation

## 2021-04-24 MED ORDER — ATORVASTATIN CALCIUM 20 MG PO TABS: 20.0000 mg | ORAL_TABLET | Freq: Every day | ORAL | 3 refills | Status: AC

## 2021-04-24 MED ORDER — ENTRESTO 24-26 MG PO TABS: 1.0000 | ORAL_TABLET | Freq: Two times a day (BID) | ORAL | 3 refills | Status: DC

## 2021-04-24 MED ORDER — EMPAGLIFLOZIN 10 MG PO TABS: 10.0000 mg | ORAL_TABLET | Freq: Every day | ORAL | 3 refills | Status: AC

## 2021-04-24 MED ORDER — DIGOXIN 125 MCG PO TABS: 0.1250 mg | ORAL_TABLET | Freq: Every morning | ORAL | 3 refills | Status: AC

## 2021-04-24 NOTE — Progress Notes (Signed)
CSW referred to assist patient with some resources and referral to [employer] as he identified that he is being evicted. Patient reports he was living with his fiancee and they made an unfortunate decision to allow his son to move in with them. Patient's son was under house arrest and selling drugs out of their home. Patient's fiancee has left and returned with her extended family. Patient and son now face eviction and he reports he has no where to go. Patient states he realizes that he made a mistake by allowing his son to stay with them and is now faced with no housing and he is currently unable to work. Patient reports a long history of cocaine use and has 2 year recovery and doesn't want to jeopardize his recovery. He spoke at length about his recovery and admitted to challenges and that "sometimes I get angry and have a hard time with my emotions".  Patient appears to be motivated to maintain recovery but lacks the resources and support at this time. CSW provided contact information for [employer] for follow up on housing and supportive services. Patient will explore resources and return call to CSW with his location for [facility] referral. Gae Dry, CCSW-MCS [phone]

## 2021-04-24 NOTE — Progress Notes (Signed)
Advanced Heart Failure Clinic Note    PCP: None  PCP-Cardiologist: [name]/Dr. [name]   HPI: Paul Mccall is a 50 y.o. male with chronic systolic HF due to NICM, HTN, bipolar disorder, h/o cocaine abuse, tobacco abuse, and medication noncompliance.    He was seen in [facility] 7 times in 2019 with CP +/- volume overload associated with medication noncompliance and ongoing cocaine use. Typically given IV lasix with improvement.    He was admitted to [facility] 12/19 with ADHF. He had not been taking medications due to confusion and cost. He had used cocaine within the week. EKG showed new LBBB at that time. Troponin was negative. Thought to be secondary to worsening CM and not ischemia.    Admitted 1/20 for ADHF w/ low output and required milrinone. EF 15-20%, grade 3 DD, 2.6 cm mobile thrombus at apical lateral myocardium, moderate MR. Started on coumadin w/ heparin bridge. Was weaned off milrinone w/ marginal co-ox ~64% but not felt to be a candidate for home inotrope's nor advanced therapies due to drug abuse. He was placed on GDMT and discharged from [facility] but failed to f/u in the [facility]. He has however been followed by [facility] and sees Dr. [name].  Admitted to [facility] 2/20, he was also incarcerated x 4 months and several of his meds were changed. Since being released from prison, he denies any further cocaine use.   Admitted to [facility] 4/21 for ADHF left AMA after 3 days. Reports he wanted to come back to [facility] for a second opinion and further care.  2021 ECHO showed LVEF 15%. RV systolic function mildly reduced.   He was seen in the HF clinic 10/08/20. Started on jardiance.  Torsemide increased to 100 BID.    In August he was set up for RHC and cancelled 3 times.   Today he returns for HF follow up.Overall feeling terrible. Stressed out because his son is living with him. Says his son is very disruptive and he is getting evicted from his apartment. SOB with exertion.  Denies  PND/Orthopnea. Appetite ok. No fever or chills. Smoking 1/2 PPD. He has not been weighing at home. Taking all medications but out lipitor 30 days, out of jardiance, entresto, and digoxin. He is trying to get disability.   SH: Living in [place] with his son. Has Medicaid. Incarcerated x 4 months in 2020. Prior poly subtance abuse but has been cocaine free since 03/2019. Continues to smoke cigarettes, 1/2 ppd. Using THC gummies.  FH: His father had an MI at 24 years old and has CHF. No other family hx of CAD, CHF, or SCD.    Cardiac Studies   RHC 06/09/18: Hemodynamics (mmHg) RA mean 2 RV 39/7 PA 37/14 (24) PCWP 14 Cardiac Output (Fick) 4.1 Cardiac Index (Fick) 2.24 PVR (MAP-CVP/CO) x 80: 195 SVR 1376   LHC 12/14/2016: no angiographic evidence of significant coaronary disease   Echo 08/2016: EF 35%, normal RV  Echo 09/2017: EF 30-35%, mild MR, normal RV Echo 09/2019 ([name]): EF 15%. RV systolic function mildly reduce     Past Medical History:  Diagnosis Date   CHF (congestive heart failure) (HCC)    Dental infection    Enlarged heart    Hernia, inguinal, right    Hypertension     Current Outpatient Medications  Medication Sig Dispense Refill   ibuprofen (ADVIL) 200 MG tablet Take 400 mg by mouth daily as needed for headache.     metolazone (ZAROXOLYN) 2.5  MG tablet Take 1 tablet (2.5 mg total) by mouth 2 (two) times a week. Every Monday and Wednesday 10 tablet 3   naproxen sodium (ALEVE) 220 MG tablet Take 220 mg by mouth daily as needed (pain.).     potassium chloride (KLOR-CON) 10 MEQ tablet Take 4 tablets (40 mEq total) by mouth 2 (two) times daily. Take extra 4 tabs on Monday and Wed with Metolazone 260 tablet 3   spironolactone (ALDACTONE) 25 MG tablet Take 1 tablet (25 mg total) by mouth in the morning. 30 tablet 3   torsemide (DEMADEX) 20 MG tablet Take 5 tablets (100 mg total) by mouth 2 (two) times daily. 300 tablet 0   atorvastatin (LIPITOR) 20 MG tablet Take 1 tablet (20  mg total) by mouth daily. (Patient not taking: Reported on 04/24/2021) 30 tablet 3   digoxin (LANOXIN) 0.125 MG tablet Take 1 tablet (0.125 mg total) by mouth in the morning. (Patient not taking: Reported on 04/24/2021) 30 tablet 3   empagliflozin (JARDIANCE) 10 MG TABS tablet Take 1 tablet (10 mg total) by mouth daily before breakfast. (Patient not taking: Reported on 04/24/2021) 30 tablet 3   rivaroxaban (XARELTO) 20 MG TABS tablet Take 1 tablet (20 mg total) by mouth daily with supper. (Patient not taking: Reported on 04/24/2021) 30 tablet 6   sacubitril-valsartan (ENTRESTO) 24-26 MG Take 1 tablet by mouth 2 (two) times daily. (Patient not taking: Reported on 04/24/2021) 60 tablet 3   No current facility-administered medications for this encounter.    Allergies  Allergen Reactions   Tape Itching and Rash    Adhesive tape      Social History   Socioeconomic History   Marital status: Significant Other    Spouse name: Not on file   Number of children: 2   Years of education: Not on file   Highest education level: Not on file  Occupational History   Not on file  Tobacco Use   Smoking status: Every Day   Smokeless tobacco: Never  Substance and Sexual Activity   Alcohol use: Yes    Comment: occasionally   Drug use: Not on file   Sexual activity: Not on file  Other Topics Concern   Not on file  Social History Narrative   07/19/18- Pt transportation issues due to inability to afford gas- has car but does not have job- disability application to be completed with [employer].  Pt also reported food insecurity- food resources provided.   Social Determinants of Health   Financial Resource Strain: Not on file  Food Insecurity: Not on file  Transportation Needs: No Transportation Needs   Lack of Transportation (Medical): No   Lack of Transportation (Non-Medical): No  Physical Activity: Not on file  Stress: Not on file  Social Connections: Not on file  Intimate Partner  Violence: Not on file      Family History  Problem Relation Age of Onset   CAD Father    Heart failure Father     Vitals:   04/24/21 1218  BP: 102/70  Pulse: 90  SpO2: 98%  Weight: 74.7 kg (164 lb 9.6 oz)     Wt Readings from Last 3 Encounters:  04/24/21 74.7 kg (164 lb 9.6 oz)  01/08/21 75 kg (165 lb 6.4 oz)  10/21/20 74.4 kg (164 lb)     PHYSICAL EXAM: General:  Walked in the clinic.  No resp difficulty HEENT: normal Neck: supple. JVP 11-12 . Carotids 2+ bilat; no bruits. No lymphadenopathy  or thryomegaly appreciated. Cor: PMI nondisplaced. Regular rate & rhythm. No rubs, gallops or murmurs. Lungs: clear Abdomen: soft, nontender, distended. No hepatosplenomegaly. No bruits or masses. Good bowel sounds. Extremities: no cyanosis, clubbing, rash, edema Neuro: alert & orientedx3, cranial nerves grossly intact. moves all 4 extremities w/o difficulty. Affect pleasant  ASSESSMENT & PLAN:  1. Chronic Systolic HF - Chronic systolic HF due to NICM from cocaine abuse.  [place] 2018 no significant coronary disease. Echo 09/2017: EF 30-35%. Echo 2/20: EF 15-20%. RHC (06/09/18) with CO 4.1, CI 2.24. Echo 07/14/18: EF 15-20%, dilated CM, anteroseptal, inferoseptal, and inferior akinesis. Otherwise severe global hypokinesis, grade 3 DD, 2.6 cm mobile thrombus at apical lateral myocardium, severely dilated LA, mildly dilated RA, moderate MR, mild calcification of aortic valve. RV mild to moderate HK.  Echo repeated at [facility] 08/2019: EF 15%. RV systolic function mildly reduced - Missed formal ECHO with EF ~25% dilated LV, LA, RV normal to maybe mildly reduced, dyssynchrony consistent with LBBB, collapsible IVC with inspiration -He has cancelled RHC 3 times.   - NYHA III. Volume status elevated but has been out of several medications.  ral medications. Refill jardiance, entresto and digoxin today. Stressed the importance of medication compliance.  - Continue torsemide 100 mg twice a day.  - Hold off  on bb with progound fatigue and severe LV dysfunction  - Continue jardiance 10 mg daily   - Continue entresto 24-26 mg bid.  - Continue spiro 25 mg daily.  - Continue Digoxin 0.125 mg daily - Hold off on RHC for now.  - Check BMET   2. Prior h/o cocaine use - Last used 03/2019 - UDS at [facility] 09/2019 negative (see in [place])  3. Tobacco Abuse - Discussed tobacco cessation.   4. H/o LV Thrombus: -noted on echo 07/2018 -Echo repeated at [facility] 3/21 and not visualized -Had been on coumadin but stopped and was placed on xarelto. Sounds like xarelto was stopped in prison.  - We talked about it again.. Sounds like he has been taking it intermittently.  He has the xarelto at home and he will need to start taking daily.   5. Social: He has disability appointment in December.  Concern is being evicted next week.  Refer to SW to HF Paramedicine in [place]. Stressed the importance of medication compliance.   Refilled jardiance, lipitor, entresto and digoxin. Refer to HF Paramedicine in [place].  Follow up in 3 weeks.  Greater than 50% of the (total minutes 30) visit spent in counseling/coordination of care regarding the above. Discussed with HF SW personally.   Darrick Grinder, NP 04/24/21

## 2021-04-24 NOTE — Patient Instructions (Signed)
Valla Leaver, Lipitor, and Digoxin refills have been returned to your pharmacy  You have been referred to [facility] Para Medicine for further CHF management in the comfort of your home. A team member will be in contact with you to arrange a home visit  Your physician recommends that you schedule a follow-up appointment in: 3-4 weeks  in the Advanced Practitioners (PA/NP) Clinic   Do the following things EVERYDAY: Weigh yourself in the morning before breakfast. Write it down and keep it in a log. Take your medicines as prescribed Eat low salt foods--Limit salt (sodium) to 2000 mg per day.  Stay as active as you can everyday Limit all fluids for the day to less than 2 liters  At the Advanced Heart Failure Clinic, you and your health needs are our priority. As part of our continuing mission to provide you with exceptional heart care, we have created designated Provider Care Teams. These Care Teams include your primary Cardiologist (physician) and Advanced Practice Providers (APPs- Physician Assistants and Nurse Practitioners) who all work together to provide you with the care you need, when you need it.   You may see any of the following providers on your designated Care Team at your next follow up: Dr Arvilla Meres Dr Carron Curie, NP Robbie Lis, [place] [facility] [place], [place] Karle Plumber, PharmD   Please be sure to bring in all your medications bottles to every appointment.   If you have any questions or concerns before your next appointment please send [place] a message through [place] or call our office at [phone].    TO LEAVE A MESSAGE FOR THE NURSE SELECT OPTION 2, PLEASE LEAVE A MESSAGE INCLUDING: YOUR NAME DATE OF BIRTH CALL BACK NUMBER REASON FOR CALL**this is important as we prioritize the call backs  YOU WILL RECEIVE A CALL BACK THE SAME DAY AS LONG AS YOU CALL BEFORE 4:00 PM

## 2021-05-21 ENCOUNTER — Other Ambulatory Visit (HOSPITAL_COMMUNITY): Payer: Self-pay

## 2021-05-21 ENCOUNTER — Telehealth (HOSPITAL_COMMUNITY): Payer: Self-pay | Admitting: Pharmacy Technician

## 2021-05-21 NOTE — Telephone Encounter (Signed)
Patient Advocate Encounter   Received notification from Medicaid that prior authorization for Paul Mccall is required.   PA submitted on [employer] [id number] W Status is pending   Will continue to follow.

## 2021-05-26 ENCOUNTER — Telehealth (HOSPITAL_COMMUNITY): Payer: Self-pay

## 2021-05-26 NOTE — Telephone Encounter (Addendum)
Called and left voice message to confirm/remind patient of their appointment at the Advanced Heart Failure Clinic on 05/27/21.

## 2021-05-26 NOTE — Progress Notes (Incomplete)
Advanced Heart Failure Clinic Note    PCP: None  PCP-Cardiologist: [name]/Dr. Haroldine Laws   HPI: Paul Mccall is a 50 y.o. male with chronic systolic HF due to NICM, HTN, bipolar disorder, h/o cocaine abuse, tobacco abuse, and medication noncompliance.    He was seen in [place] 7 times in 2019 with CP +/- volume overload associated with medication noncompliance and ongoing cocaine use. Typically given IV lasix with improvement.    He was admitted to [facility] 12/19 with ADHF. He had not been taking medications due to confusion and cost. He had used cocaine within the week. EKG showed new LBBB at that time. Troponin was negative. Thought to be secondary to worsening CM and not ischemia.    Admitted 1/20 for ADHF w/ low output and required milrinone. EF 15-20%, grade 3 DD, 2.6 cm mobile thrombus at apical lateral myocardium, moderate MR. Started on coumadin w/ heparin bridge. Was weaned off milrinone w/ marginal co-ox ~64% but not felt to be a candidate for home inotrope's nor advanced therapies due to drug abuse. He was placed on GDMT and discharged from [facility] but failed to f/u in the [facility]. He has however been followed by [facility] and sees Dr. Minette Brine.  Admitted to [facility] 2/20, he was also incarcerated x 4 months and several of his meds were changed. Since being released from prison, he denies any further cocaine use.   Admitted to [facility] 4/21 for ADHF left AMA after 3 days. Reports he wanted to come back to [facility] for a second opinion and further care.  2021 ECHO showed LVEF 15%. RV systolic function mildly reduced.   He was seen in the HF clinic 10/08/20. Started on jardiance.  Torsemide increased to 100 BID.    In August he was set up for [place] and cancelled 3 times.   Today he returns for HF follow up.Overall feeling terrible. Stressed out because his son is living with him. Says his son is very disruptive and he is getting evicted from his apartment. SOB with exertion.  Denies  PND/Orthopnea. Appetite ok. No fever or chills. Smoking 1/2 PPD. He has not been weighing at home. Taking all medications but out lipitor 30 days, out of jardiance, entresto, and digoxin. He is trying to get disability.   SH: Living in [place] with his son. Has Medicaid. Incarcerated x 4 months in 2020. Prior poly subtance abuse but has been cocaine free since 03/2019. Continues to smoke cigarettes, 1/2 ppd. Using THC gummies.  FH: His father had an MI at 70 years old and has CHF. No other family hx of CAD, CHF, or SCD.    Cardiac Studies   RHC 06/09/18: Hemodynamics (mmHg) RA mean 2 RV 39/7 PA 37/14 (24) PCWP 14 Cardiac Output (Fick) 4.1 Cardiac Index (Fick) 2.24 PVR (MAP-CVP/CO) x 80: 195 SVR 1376   LHC 12/14/2016: no angiographic evidence of significant coaronary disease   Echo 08/2016: EF 35%, normal RV  Echo 09/2017: EF 30-35%, mild MR, normal RV Echo 09/2019 ([name]): EF 15%. RV systolic function mildly reduce     Past Medical History:  Diagnosis Date   CHF (congestive heart failure) (HCC)    Dental infection    Enlarged heart    Hernia, inguinal, right    Hypertension     Current Outpatient Medications  Medication Sig Dispense Refill   atorvastatin (LIPITOR) 20 MG tablet Take 1 tablet (20 mg total) by mouth daily. 30 tablet 3   digoxin (LANOXIN) 0.125  MG tablet Take 1 tablet (0.125 mg total) by mouth in the morning. 30 tablet 3   empagliflozin (JARDIANCE) 10 MG TABS tablet Take 1 tablet (10 mg total) by mouth daily before breakfast. 30 tablet 3   ibuprofen (ADVIL) 200 MG tablet Take 400 mg by mouth daily as needed for headache.     metolazone (ZAROXOLYN) 2.5 MG tablet Take 1 tablet (2.5 mg total) by mouth 2 (two) times a week. Every Monday and Wednesday 10 tablet 3   naproxen sodium (ALEVE) 220 MG tablet Take 220 mg by mouth daily as needed (pain.).     potassium chloride (KLOR-CON) 10 MEQ tablet Take 4 tablets (40 mEq total) by mouth 2 (two) times daily. Take extra 4 tabs  on Monday and Wed with Metolazone 260 tablet 3   rivaroxaban (XARELTO) 20 MG TABS tablet Take 1 tablet (20 mg total) by mouth daily with supper. (Patient not taking: Reported on 04/24/2021) 30 tablet 6   sacubitril-valsartan (ENTRESTO) 24-26 MG Take 1 tablet by mouth 2 (two) times daily. 60 tablet 3   spironolactone (ALDACTONE) 25 MG tablet Take 1 tablet (25 mg total) by mouth in the morning. 30 tablet 3   torsemide (DEMADEX) 20 MG tablet Take 5 tablets (100 mg total) by mouth 2 (two) times daily. 300 tablet 0   No current facility-administered medications for this visit.    Allergies  Allergen Reactions   Tape Itching and Rash    Adhesive tape      Social History   Socioeconomic History   Marital status: Significant Other    Spouse name: Not on file   Number of children: 2   Years of education: Not on file   Highest education level: Not on file  Occupational History   Not on file  Tobacco Use   Smoking status: Every Day   Smokeless tobacco: Never  Substance and Sexual Activity   Alcohol use: Yes    Comment: occasionally   Drug use: Not on file   Sexual activity: Not on file  Other Topics Concern   Not on file  Social History Narrative   07/19/18- Pt transportation issues due to inability to afford gas- has car but does not have job- disability application to be completed with [employer].  Pt also reported food insecurity- food resources provided.   Social Determinants of Health   Financial Resource Strain: Not on file  Food Insecurity: Not on file  Transportation Needs: No Transportation Needs   Lack of Transportation (Medical): No   Lack of Transportation (Non-Medical): No  Physical Activity: Not on file  Stress: Not on file  Social Connections: Not on file  Intimate Partner Violence: Not on file      Family History  Problem Relation Age of Onset   CAD Father    Heart failure Father     There were no vitals filed for this visit.    Wt Readings from  Last 3 Encounters:  04/24/21 74.7 kg (164 lb 9.6 oz)  01/08/21 75 kg (165 lb 6.4 oz)  10/21/20 74.4 kg (164 lb)     PHYSICAL EXAM: General:  Walked in the clinic.  No resp difficulty HEENT: normal Neck: supple. JVP 11-12 . Carotids 2+ bilat; no bruits. No lymphadenopathy or thryomegaly appreciated. Cor: PMI nondisplaced. Regular rate & rhythm. No rubs, gallops or murmurs. Lungs: clear Abdomen: soft, nontender, distended. No hepatosplenomegaly. No bruits or masses. Good bowel sounds. Extremities: no cyanosis, clubbing, rash, edema Neuro: alert &  orientedx3, cranial nerves grossly intact. moves all 4 extremities w/o difficulty. Affect pleasant  ASSESSMENT & PLAN:  1. Chronic Systolic HF - Chronic systolic HF due to NICM from cocaine abuse.  LHC 2018 no significant coronary disease. Echo 09/2017: EF 30-35%. Echo 2/20: EF 15-20%. RHC (06/09/18) with CO 4.1, CI 2.24. Echo 07/14/18: EF 15-20%, dilated CM, anteroseptal, inferoseptal, and inferior akinesis. Otherwise severe global hypokinesis, grade 3 DD, 2.6 cm mobile thrombus at apical lateral myocardium, severely dilated LA, mildly dilated RA, moderate MR, mild calcification of aortic valve. RV mild to moderate HK.  Echo repeated at [facility] 08/2019: EF 15%. RV systolic function mildly reduced - Missed formal ECHO with EF ~25% dilated LV, LA, RV normal to maybe mildly reduced, dyssynchrony consistent with LBBB, collapsible IVC with inspiration -He has cancelled RHC 3 times.   - NYHA III. Volume status elevated but has been out of several medications.  ral medications. Refill jardiance, entresto and digoxin today. Stressed the importance of medication compliance.  - Continue torsemide 100 mg twice a day.  - Hold off on bb with progound fatigue and severe LV dysfunction  - Continue jardiance 10 mg daily   - Continue entresto 24-26 mg bid.  - Continue spiro 25 mg daily.  - Continue Digoxin 0.125 mg daily - Hold off on RHC for now.  - Check BMET   2.  Prior h/o cocaine use - Last used 03/2019 - UDS at [facility] 09/2019 negative (see in Care Everywhere)  3. Tobacco Abuse - Discussed tobacco cessation.   4. H/o LV Thrombus: -noted on echo 07/2018 -Echo repeated at [facility] 3/21 and not visualized -Had been on coumadin but stopped and was placed on xarelto. Sounds like xarelto was stopped in prison.  - We talked about it again.. Sounds like he has been taking it intermittently.  He has the xarelto at home and he will need to start taking daily.   5. Social: He has disability appointment in December.  Concern is being evicted next week.  Refer to SW to HF Paramedicine in [place]. Stressed the importance of medication compliance.   Refilled jardiance, lipitor, entresto and digoxin. Refer to HF Paramedicine in [place].  Follow up in 3 weeks.  Greater than 50% of the (total minutes 30) visit spent in counseling/coordination of care regarding the above. Discussed with HF SW personally.   Anderson Malta [place], FNP 05/26/21

## 2021-05-27 ENCOUNTER — Encounter (HOSPITAL_COMMUNITY): Payer: Medicaid Other

## 2021-05-27 NOTE — Telephone Encounter (Signed)
Advanced Heart Failure Patient Advocate Encounter  Prior Authorization for Sherryll Burger has been approved.    PA#  [id number] Effective dates: 05/21/21 through 05/21/22  Archer Asa, CPhT

## 2021-05-27 NOTE — Progress Notes (Incomplete)
Advanced Heart Failure Clinic Note    PCP: None  PCP-Cardiologist: [name]/Dr. [name]   HPI: Paul Mccall is a 50 y.o. male with chronic systolic HF due to NICM, HTN, bipolar disorder, h/o cocaine abuse, tobacco abuse, and medication noncompliance.    He was seen in [facility] 7 times in 2019 with CP +/- volume overload associated with medication noncompliance and ongoing cocaine use. Typically given IV lasix with improvement.    He was admitted to [facility] 12/19 with ADHF. He had not been taking medications due to confusion and cost. He had used cocaine within the week. EKG showed new LBBB at that time. Troponin was negative. Thought to be secondary to worsening CM and not ischemia.    Admitted 1/20 for ADHF w/ low output and required milrinone. EF 15-20%, grade 3 DD, 2.6 cm mobile thrombus at apical lateral myocardium, moderate MR. Started on coumadin w/ heparin bridge. Was weaned off milrinone w/ marginal co-ox ~64% but not felt to be a candidate for home inotrope's nor advanced therapies due to drug abuse. He was placed on GDMT and discharged from [facility] but failed to f/u in the [facility]. He has however been followed by [facility] and sees Dr. [name].  Admitted to [facility] 2/20, he was also incarcerated x 4 months and several of his meds were changed. Since being released from prison, he denies any further cocaine use.   Admitted to [facility] 4/21 for ADHF left AMA after 3 days. Reports he wanted to come back to [facility] for a second opinion and further care.  2021 ECHO showed LVEF 15%. RV systolic function mildly reduced.   He was seen in the HF clinic 10/08/20. Started on jardiance.  Torsemide increased to 100 BID.    In August he was set up for [facility] and cancelled 3 times.   Seen for follow-up 11/22. Taking all medications but out lipitor 30 days, out of jardiance, entresto, and digoxin. Medications were refilled. Referred to HF Paramedicine in [facility]. He is trying to get  disability.   SH: Living in [place] with his son. Has Medicaid. Incarcerated x 4 months in 2020. Prior poly subtance abuse but has been cocaine free since 03/2019. Continues to smoke cigarettes, 1/2 ppd. Using THC gummies.  FH: His father had an MI at 5 years old and has CHF. No other family hx of CAD, CHF, or SCD.    Cardiac Studies   RHC 06/09/18: Hemodynamics (mmHg) RA mean 2 RV 39/7 PA 37/14 (24) PCWP 14 Cardiac Output (Fick) 4.1 Cardiac Index (Fick) 2.24 PVR (MAP-CVP/CO) x 80: 195 SVR 1376   LHC 12/14/2016: no angiographic evidence of significant coaronary disease   Echo 08/2016: EF 35%, normal RV  Echo 09/2017: EF 30-35%, mild MR, normal RV Echo 09/2019 ([name]): EF 15%. RV systolic function mildly reduce     Past Medical History:  Diagnosis Date   CHF (congestive heart failure) (HCC)    Dental infection    Enlarged heart    Hernia, inguinal, right    Hypertension     Current Outpatient Medications  Medication Sig Dispense Refill   atorvastatin (LIPITOR) 20 MG tablet Take 1 tablet (20 mg total) by mouth daily. 30 tablet 3   digoxin (LANOXIN) 0.125 MG tablet Take 1 tablet (0.125 mg total) by mouth in the morning. 30 tablet 3   empagliflozin (JARDIANCE) 10 MG TABS tablet Take 1 tablet (10 mg total) by mouth daily before breakfast. 30 tablet 3  ibuprofen (ADVIL) 200 MG tablet Take 400 mg by mouth daily as needed for headache.     metolazone (ZAROXOLYN) 2.5 MG tablet Take 1 tablet (2.5 mg total) by mouth 2 (two) times a week. Every Monday and Wednesday 10 tablet 3   naproxen sodium (ALEVE) 220 MG tablet Take 220 mg by mouth daily as needed (pain.).     potassium chloride (KLOR-CON) 10 MEQ tablet Take 4 tablets (40 mEq total) by mouth 2 (two) times daily. Take extra 4 tabs on Monday and Wed with Metolazone 260 tablet 3   rivaroxaban (XARELTO) 20 MG TABS tablet Take 1 tablet (20 mg total) by mouth daily with supper. (Patient not taking: Reported on 04/24/2021) 30 tablet 6    sacubitril-valsartan (ENTRESTO) 24-26 MG Take 1 tablet by mouth 2 (two) times daily. 60 tablet 3   spironolactone (ALDACTONE) 25 MG tablet Take 1 tablet (25 mg total) by mouth in the morning. 30 tablet 3   torsemide (DEMADEX) 20 MG tablet Take 5 tablets (100 mg total) by mouth 2 (two) times daily. 300 tablet 0   No current facility-administered medications for this visit.    Allergies  Allergen Reactions   Tape Itching and Rash    Adhesive tape      Social History   Socioeconomic History   Marital status: Significant Other    Spouse name: Not on file   Number of children: 2   Years of education: Not on file   Highest education level: Not on file  Occupational History   Not on file  Tobacco Use   Smoking status: Every Day   Smokeless tobacco: Never  Substance and Sexual Activity   Alcohol use: Yes    Comment: occasionally   Drug use: Not on file   Sexual activity: Not on file  Other Topics Concern   Not on file  Social History Narrative   07/19/18- Pt transportation issues due to inability to afford gas- has car but does not have job- disability application to be completed with [employer].  Pt also reported food insecurity- food resources provided.   Social Determinants of Health   Financial Resource Strain: Not on file  Food Insecurity: Not on file  Transportation Needs: No Transportation Needs   Lack of Transportation (Medical): No   Lack of Transportation (Non-Medical): No  Physical Activity: Not on file  Stress: Not on file  Social Connections: Not on file  Intimate Partner Violence: Not on file      Family History  Problem Relation Age of Onset   CAD Father    Heart failure Father     There were no vitals filed for this visit.    Wt Readings from Last 3 Encounters:  04/24/21 74.7 kg (164 lb 9.6 oz)  01/08/21 75 kg (165 lb 6.4 oz)  10/21/20 74.4 kg (164 lb)     PHYSICAL EXAM: General:  Walked in the clinic.  No resp difficulty HEENT:  normal Neck: supple. JVP 11-12 . Carotids 2+ bilat; no bruits. No lymphadenopathy or thryomegaly appreciated. Cor: PMI nondisplaced. Regular rate & rhythm. No rubs, gallops or murmurs. Lungs: clear Abdomen: soft, nontender, distended. No hepatosplenomegaly. No bruits or masses. Good bowel sounds. Extremities: no cyanosis, clubbing, rash, edema Neuro: alert & orientedx3, cranial nerves grossly intact. moves all 4 extremities w/o difficulty. Affect pleasant  ASSESSMENT & PLAN:  1. Chronic Systolic HF - Chronic systolic HF due to NICM from cocaine abuse.  LHC 2018 no significant coronary disease. Echo  09/2017: EF 30-35%. Echo 2/20: EF 15-20%. RHC (06/09/18) with CO 4.1, CI 2.24. Echo 07/14/18: EF 15-20%, dilated CM, anteroseptal, inferoseptal, and inferior akinesis. Otherwise severe global hypokinesis, grade 3 DD, 2.6 cm mobile thrombus at apical lateral myocardium, severely dilated LA, mildly dilated RA, moderate MR, mild calcification of aortic valve. RV mild to moderate HK.  Echo repeated at [facility] 08/2019: EF 15%. RV systolic function mildly reduced - Missed formal ECHO with EF ~25% dilated LV, LA, RV normal to maybe mildly reduced, dyssynchrony consistent with LBBB, collapsible IVC with inspiration -He has cancelled RHC 3 times.   - NYHA III. Volume ***.   - Continue torsemide 100 mg twice a day.  - Hold off on bb with progound fatigue and severe LV dysfunction  - Continue jardiance 10 mg daily   - Continue entresto 24-26 mg bid.  - Continue spiro 25 mg daily.  - Continue Digoxin 0.125 mg daily - Hold off on RHC for now.  - Check BMET   2. Prior h/o cocaine use - Last used 03/2019 - UDS at [facility] 09/2019 negative (see in [place])  3. Tobacco Abuse - Discussed tobacco cessation.   4. H/o LV Thrombus: -noted on echo 07/2018 -Echo repeated at [facility] 3/21 and not visualized -Had been on coumadin but stopped and was placed on xarelto. Sounds like xarelto was stopped in prison.  - We talked  about it again.. Sounds like he has been taking it intermittently.  He has the xarelto at home and he will need to start taking daily.   5. Social: He has disability appointment in December.  Concern is being evicted next week.  Refer to SW to HF Paramedicine in [place]. Stressed the importance of medication compliance.    Follow up ***  Damere Brandenburg N, PA-C 05/27/21

## 2021-05-28 ENCOUNTER — Telehealth (HOSPITAL_COMMUNITY): Payer: Self-pay

## 2021-05-28 NOTE — Telephone Encounter (Signed)
Called and left patient a detailed message to confirm/remind patient of their appointment at the Advanced Heart Failure Clinic on 05/29/21.

## 2021-05-29 ENCOUNTER — Encounter (HOSPITAL_COMMUNITY): Payer: Medicaid Other

## 2021-05-31 ENCOUNTER — Other Ambulatory Visit (HOSPITAL_COMMUNITY): Payer: Self-pay | Admitting: Internal Medicine

## 2021-06-03 ENCOUNTER — Other Ambulatory Visit (HOSPITAL_COMMUNITY): Payer: Self-pay | Admitting: Internal Medicine

## 2021-06-03 MED ORDER — TORSEMIDE 20 MG PO TABS: 100.0000 mg | ORAL_TABLET | Freq: Two times a day (BID) | ORAL | 0 refills | Status: DC

## 2021-06-23 ENCOUNTER — Other Ambulatory Visit (HOSPITAL_COMMUNITY): Payer: Self-pay | Admitting: Internal Medicine

## 2021-06-23 MED ORDER — TORSEMIDE 20 MG PO TABS: 100.0000 mg | ORAL_TABLET | Freq: Every day | ORAL | 5 refills | Status: DC

## 2021-06-24 ENCOUNTER — Other Ambulatory Visit (HOSPITAL_COMMUNITY): Payer: Self-pay

## 2021-06-24 MED ORDER — TORSEMIDE 20 MG PO TABS: 100.0000 mg | ORAL_TABLET | Freq: Two times a day (BID) | ORAL | 0 refills | Status: AC

## 2021-06-30 ENCOUNTER — Telehealth (HOSPITAL_COMMUNITY): Payer: Self-pay

## 2021-06-30 NOTE — Telephone Encounter (Signed)
Called to confirm/remind patient of their appointment at the Advanced Heart Failure Clinic on 07/01/21.   Patient reminded to bring all medications and/or complete list.  Confirmed patient has transportation. Gave directions, instructed to utilize valet parking.  Confirmed appointment prior to ending call.

## 2021-06-30 NOTE — Progress Notes (Signed)
Advanced Heart Failure Clinic Note    PCP: None  PCP-Cardiologist: [name]/Dr. Haroldine Laws   HPI: Paul Mccall is a 51 y.o.. male with chronic systolic HF due to NICM, HTN, bipolar disorder, h/o cocaine abuse, tobacco abuse, and medication noncompliance.    He was seen in [facility] ED 7 times in 2019 with CP +/- volume overload associated with medication noncompliance and ongoing cocaine use. Typically given IV lasix with improvement.    Admitted (12/19) to [facility] with ADHF. He had not been taking medications due to confusion and cost. He had used cocaine within the week. EKG showed new LBBB at that time. Troponin was negative. Thought to be secondary to worsening CM and not ischemia.    Admitted 1/20 for ADHF w/ low output and required milrinone. EF 15-20%, grade 3 DD, 2.6 cm mobile thrombus at apical lateral myocardium, moderate MR. Started on Coumadin w/ heparin bridge. Weaned off milrinone w/ marginal co-ox ~64% but not felt to be a candidate for home inotrope's nor advanced therapies due to drug abuse. Placed on GDMT and discharged, but failed to f/u in the [facility]. He has however been followed by [facility] and sees Dr. Minette Brine.  Admitted to [facility] 2/20, he was also incarcerated x 4 months and several of his meds were changed. Since being released from prison, he denies any further cocaine use.   Admitted to [facility] 4/21 for ADHF left AMA after 3 days. Reports he wanted to come back to [facility] for a second opinion and further care.  2021 ECHO showed LVEF 15%. RV systolic function mildly reduced.   He was seen in the HF clinic 10/08/20. Started on Jardiance and torsemide increased to 100 mg bid.    In August 2022 he was set up for [place] and cancelled 3 times.   Follow up 11/22, he was stressed due to family issues. Out of [place], [place] and digoxin. Meds refilled and referred to [facility].  Today he returns for HF follow up. He cancelled and no-showed last 2 appts.  He is SOB with minimal activity, has to stop twice from walking from car to entrance of grocery store. Getting worse over past 1-2 weeks. He has been taking his meds, missed a couple days of torsemide last week due to pharmacy mix up. Unable to get refill for metolazone so has not taken in 1.5 weeks. He is staying in a motel now and his disability is approved. He feels bloated and legs are starting to swell. He has some dizziness but no syncope. He chronically sleep reclined. Denies CP or palpitations. Has noticed bleeding with BMs, and attributes this to hemorrhoids. Appetite ok. No fever or chills. Weight at home 163-165 pounds. Smoking 1 pack/3-4 days, no cocaine in 2 years, smokes some THC. He has a lot of life stressors right now.   SH: Living in [place]. Has Medicaid. Incarcerated x 4 months in 2020. Prior poly substance abuse but has been cocaine free since 03/2019. Continues to smoke cigarettes, 1/2 ppd. Using THC gummies.   FH: His father had an MI at 65 years old and has CHF. No other family hx of CAD, CHF, or SCD.    Cardiac Studies   - RHC 06/09/18: Hemodynamics (mmHg) RA mean 2 RV 39/7 PA 37/14 (24) PCWP 14 Cardiac Output (Fick) 4.1 Cardiac Index (Fick) 2.24 PVR (MAP-CVP/CO) x 80: 195 SVR 1376   - LHC 12/14/2016: no angiographic evidence of significant coaronary disease   - Echo 08/2016:  EF 35%, normal RV - Echo 09/2017: EF 30-35%, mild MR, normal RV - Echo 09/2019 ([name]): EF 15%. RV systolic function mildly reduce   Past Medical History:  Diagnosis Date   CHF (congestive heart failure) (HCC)    Dental infection    Enlarged heart    Hernia, inguinal, right    Hypertension    Current Outpatient Medications  Medication Sig Dispense Refill   atorvastatin (LIPITOR) 20 MG tablet Take 1 tablet (20 mg total) by mouth daily. 30 tablet 3   digoxin (LANOXIN) 0.125 MG tablet Take 1 tablet (0.125 mg total) by mouth in the morning. 30 tablet 3   empagliflozin (JARDIANCE) 10 MG TABS tablet  Take 1 tablet (10 mg total) by mouth daily before breakfast. 30 tablet 3   ibuprofen (ADVIL) 200 MG tablet Take 400 mg by mouth daily as needed for headache.     naproxen sodium (ALEVE) 220 MG tablet Take 220 mg by mouth daily as needed (pain.).     Potassium Chloride (KLOR-CON PO) Take 10 mg by mouth. Patient takes 2 tablets in the morning and evening.     rivaroxaban (XARELTO) 20 MG TABS tablet Take 1 tablet (20 mg total) by mouth daily with supper. 30 tablet 6   sacubitril-valsartan (ENTRESTO) 24-26 MG Take 1 tablet by mouth 2 (two) times daily. 60 tablet 3   spironolactone (ALDACTONE) 25 MG tablet Take 1 tablet (25 mg total) by mouth in the morning. 30 tablet 3   torsemide (DEMADEX) 20 MG tablet Take 5 tablets (100 mg total) by mouth 2 (two) times daily. 300 tablet 0   metolazone (ZAROXOLYN) 2.5 MG tablet Take 1 tablet (2.5 mg total) by mouth 2 (two) times a week. Every Monday and Wednesday (Patient not taking: Reported on 07/01/2021) 10 tablet 3   No current facility-administered medications for this encounter.   Allergies  Allergen Reactions   Tape Itching and Rash    Adhesive tape   Social History   Socioeconomic History   Marital status: Significant Other    Spouse name: Not on file   Number of children: 2   Years of education: Not on file   Highest education level: Not on file  Occupational History   Not on file  Tobacco Use   Smoking status: Every Day   Smokeless tobacco: Never  Substance and Sexual Activity   Alcohol use: Yes    Comment: occasionally   Drug use: Not on file   Sexual activity: Not on file  Other Topics Concern   Not on file  Social History Narrative   07/19/18- Pt transportation issues due to inability to afford gas- has car but does not have job- disability application to be completed with [employer].  Pt also reported food insecurity- food resources provided.   Social Determinants of Health   Financial Resource Strain: Not on file  Food  Insecurity: Not on file  Transportation Needs: No Transportation Needs   Lack of Transportation (Medical): No   Lack of Transportation (Non-Medical): No  Physical Activity: Not on file  Stress: Not on file  Social Connections: Not on file  Intimate Partner Violence: Not on file   Family History  Problem Relation Age of Onset   CAD Father    Heart failure Father    BP (!) 84/64    Pulse 84    Wt 77.1 kg (170 lb)    SpO2 100%    BMI 25.85 kg/m   Wt Readings from Last  3 Encounters:  07/01/21 77.1 kg (170 lb)  04/24/21 74.7 kg (164 lb 9.6 oz)  01/08/21 75 kg (165 lb 6.4 oz)   PHYSICAL EXAM: General:  NAD. Mild SOB speaking full sentences, fatigued-appearing, pale HEENT: Normal Neck: Supple. JVP to ear. Carotids 2+ bilat; no bruits. No lymphadenopathy or thryomegaly appreciated. Cor: PMI nondisplaced. Regular rate & rhythm. No rubs, gallops, 2/6 MR Lungs: Clear, diminished in bases. Abdomen: nontender, + distended. No hepatosplenomegaly. No bruits or masses. Good bowel sounds. Extremities: No cyanosis, clubbing, rash, 1+ BLE edema, legs warm, arms cool Neuro: Alert & oriented x 3, cranial nerves grossly intact. Moves all 4 extremities w/o difficulty. Affect pleasant.  ECG (personally reviewed): NSR IVCD qrs 172 msec.  ReDs: 48%  ASSESSMENT & PLAN: 1. Acute on chronic Systolic HF - Chronic systolic HF due to NICM from cocaine abuse.   - LHC 2018 no significant coronary disease.  - Echo 09/2017: EF 30-35%.  - Echo 2/20: EF 15-20%. RHC (06/09/18) with CO 4.1, CI 2.24.  - Echo 07/14/18: EF 15-20%, dilated CM, anteroseptal, inferoseptal, and inferior akinesis. Otherwise severe global hypokinesis, grade 3 DD, 2.6 cm mobile thrombus at apical lateral myocardium, severely dilated LA, mildly dilated RA, moderate MR, mild calcification of aortic valve. RV mild to moderate HK.   - Echo repeated at [facility] 08/2019: EF 15%. RV systolic function mildly reduced - He has cancelled RHC 3 times.   -  Worse NYHA IIIb- early IV. Volume status up today, likely has 10-15 lbs of fluid on board. ReDs 48% - Discussed admission for IV diuresis + short term inotrope, he declined this and wants to try aggressive outpatient diuresis. - Give Lasix 80 mg IV + metolazone 2.5 mg + 40 KCL today in clinic. - Starting tomorrow (07/02/21), take metolazone 2.5 + 40 KCL and Thursday (07/03/21). - Hold Entresto with low BP.  - Continue torsemide 100 mg bid. - Hold off on bb with progound fatigue and severe LV dysfunction.  - Continue Jardiance 10 mg daily.   - Continue spiro 25 mg daily.  - Continue digoxin 0.125 mg daily. - No a candidate for advanced therapies with non-compliance. - Labs today. Repeat BMET next week.  2. Prior h/o cocaine use - Last used 03/2019. - UDS at [facility] 09/2019 negative (see in Care Everywhere).  3. Tobacco Abuse - Discussed tobacco cessation.   4. H/o LV Thrombus: - Noted on echo 07/2018 - Echo repeated at [facility] 3/21 and not visualized - Had been on Coumadin but stopped and was placed on Xarelto. Sounds like xarelto was stopped in prison.  - Continue Xarelto 20 mg daily. - CBC today.  5. Social: - He has been approved for disability.  - Living in motel and in his boss's car. - He was referred to Paramedicine in [place] but ultimately changed his mind. - Stressed the importance of medication compliance.  - HFSW to follow up next week with further needs.  Discussed with Dr. Haroldine Laws. Recommend admission for IV diuresis and possible inotropic support. He declined this and would like to try outpatient aggressive diuresis. Long discussion about trajectory of his heart failure. I am worried he will not do well over the next couple of days. He understands the risks associated with going home today. He has voided >1L urine in clinic, feels some better.  BP marginally better, but still low. He is requesting to leave to pick up his grandson. I strongly  urged him not to drive with BP  86 systolic.  Follow up with APP next week, if no better will need admission.  Rafael Bihari, [place] 07/01/21  Patient seen and examined with the above-signed Advanced Practice Provider and/or Housestaff. I personally reviewed laboratory data, imaging studies and relevant notes. I independently examined the patient and formulated the important aspects of the plan. I have edited the note to reflect any of my changes or salient points. I have personally discussed the plan with the patient and/or family.  He is very tenuous and appears to be be low output with significant volume overload. Systolic BP low.   General:  Weak appearing. No resp difficulty HEENT: normal Neck: supple.JVP to ear. Carotids 2+ bilat; no bruits. No lymphadenopathy or thryomegaly appreciated. Cor: PMI nondisplaced. Regular rate & rhythm. +s3 Lungs: clear Abdomen: soft, nontender, nondistended. No hepatosplenomegaly. No bruits or masses. Good bowel sounds. Extremities: no cyanosis, clubbing, rash, 1-2+ edema cool  Neuro: alert & orientedx3, cranial nerves grossly intact. moves all 4 extremities w/o difficulty. Affect pleasant  He appears end-stage. Long discussion about need for admission and attempt to stabilize but he refuses. He realizes there is high risk for further decompensation and death. We told him to call and we can arrange for direct admission if he is feeling worse. Otherwise plan as above.   Total time spent 45 minutes. Over half that time spent discussing above.   Glori Bickers, MD  9:57 PM

## 2021-07-01 ENCOUNTER — Other Ambulatory Visit: Payer: Self-pay

## 2021-07-01 ENCOUNTER — Ambulatory Visit (HOSPITAL_COMMUNITY)
Admission: RE | Admit: 2021-07-01 | Discharge: 2021-07-01 | Disposition: A | Discharge status: Home / Self Care | Payer: Medicaid Other | Source: Ambulatory Visit | Attending: Family Medicine | Admitting: Family Medicine

## 2021-07-01 ENCOUNTER — Encounter (HOSPITAL_COMMUNITY): Payer: Self-pay

## 2021-07-01 VITALS — BP 86/60 | HR 84 | Wt 170.0 lb

## 2021-07-01 DIAGNOSIS — I454 Nonspecific intraventricular block: Secondary | ICD-10-CM | POA: Diagnosis not present

## 2021-07-01 DIAGNOSIS — I5023 Acute on chronic systolic (congestive) heart failure: Secondary | ICD-10-CM | POA: Insufficient documentation

## 2021-07-01 DIAGNOSIS — F1410 Cocaine abuse, uncomplicated: Secondary | ICD-10-CM

## 2021-07-01 DIAGNOSIS — I5022 Chronic systolic (congestive) heart failure: Secondary | ICD-10-CM | POA: Diagnosis not present

## 2021-07-01 DIAGNOSIS — Z720 Tobacco use: Secondary | ICD-10-CM | POA: Diagnosis not present

## 2021-07-01 LAB — COMPREHENSIVE METABOLIC PANEL
ALT: 29 U/L (ref 0–44)
AST: 26 U/L (ref 15–41)
Albumin: 4 g/dL (ref 3.5–5.0)
Alkaline Phosphatase: 112 U/L (ref 38–126)
Anion gap: 8 (ref 5–15)
BUN: 16 mg/dL (ref 6–20)
CO2: 28 mmol/L (ref 22–32)
Calcium: 8.7 mg/dL — ABNORMAL LOW (ref 8.9–10.3)
Chloride: 99 mmol/L (ref 98–111)
Creatinine, Ser: 1.26 mg/dL — ABNORMAL HIGH (ref 0.61–1.24)
GFR, Estimated: >60 mL/min (ref >60)
Glucose, Bld: 88 mg/dL (ref 70–99)
Potassium: 3.5 mmol/L (ref 3.5–5.1)
Sodium: 135 mmol/L (ref 135–145)
Total Bilirubin: 0.7 mg/dL (ref 0.3–1.2)
Total Protein: 6.7 g/dL (ref 6.5–8.1)

## 2021-07-01 LAB — CBC
HCT: 40 % (ref 39.0–52.0)
Hemoglobin: 13.5 g/dL (ref 13.0–17.0)
MCH: 30.5 pg (ref 26.0–34.0)
MCHC: 33.8 g/dL (ref 30.0–36.0)
MCV: 90.5 fL (ref 80.0–100.0)
Platelets: 306 10*3/uL (ref 150–400)
RBC: 4.42 MIL/uL (ref 4.22–5.81)
RDW: 13.2 % (ref 11.5–15.5)
WBC: 8.6 10*3/uL (ref 4.0–10.5)
nRBC: 0 % (ref 0.0–0.2)

## 2021-07-01 LAB — DIGOXIN LEVEL: Digoxin Level: 0.6 ng/mL — ABNORMAL LOW (ref 0.8–2.0)

## 2021-07-01 LAB — BRAIN NATRIURETIC PEPTIDE: B Natriuretic Peptide: 1488.7 pg/mL — ABNORMAL HIGH (ref 0.0–100.0)

## 2021-07-01 MED ORDER — FUROSEMIDE 10 MG/ML IJ SOLN
80.0000 mg | Freq: Once | INTRAMUSCULAR | Status: AC
  Administered 2021-07-01: 10:00:00 80 mg via INTRAVENOUS

## 2021-07-01 MED ORDER — METOLAZONE 2.5 MG PO TABS
2.5000 mg | ORAL_TABLET | Freq: Once | ORAL | Status: AC
  Administered 2021-07-01: 10:00:00 2.5 mg via ORAL

## 2021-07-01 MED ORDER — POTASSIUM CHLORIDE CRYS ER 20 MEQ PO TBCR
40.0000 meq | EXTENDED_RELEASE_TABLET | Freq: Once | ORAL | Status: AC
  Administered 2021-07-01: 10:00:00 40 meq via ORAL

## 2021-07-01 NOTE — Patient Instructions (Signed)
Medication Changes:  Hold Entresto  Take Metolazone 2.5mg  with of Potassium (2 TABS)  for the next 2 days  Lab Work:  Labs done today, your results will be available in MyChart, we will contact you for abnormal readings.   Testing/Procedures:  none  Referrals:  none  Special Instructions // Education:  PLEASE IF YOUR SYMPTOMS CONTINUE AND YOU DON'T FEEL BETTER PLEASE CALL 911 AND GO TO THE EMERGENCY ROOM  Follow-Up in: 1 week  At the Advanced Heart Failure Clinic, you and your health needs are our priority. We have a designated team specialized in the treatment of Heart Failure. This Care Team includes your primary Heart Failure Specialized Cardiologist (physician), Advanced Practice Providers (APPs- Physician Assistants and Nurse Practitioners), and Pharmacist who all work together to provide you with the care you need, when you need it.   You may see any of the following providers on your designated Care Team at your next follow up:  Dr Arvilla Meres Dr Carron Curie, NP Robbie Lis, [place] [facility] [place], [place] Karle Plumber, PharmD   Please be sure to bring in all your medications bottles to every appointment.   Need to Contact us:  If you have any questions or concerns before your next appointment please send [place] a message through [place] or call our office at [phone].    TO LEAVE A MESSAGE FOR THE NURSE SELECT OPTION 2, PLEASE LEAVE A MESSAGE INCLUDING: YOUR NAME DATE OF BIRTH CALL BACK NUMBER REASON FOR CALL**this is important as we prioritize the call backs  YOU WILL RECEIVE A CALL BACK THE SAME DAY AS LONG AS YOU CALL BEFORE 4:00 PM

## 2021-07-01 NOTE — Progress Notes (Signed)
Post administration of Iv lasix and PO Metolazone. Patient emptied 1150cc of urine.   Blood pressure was still low at 86 systolic but he stated he had to leave and collect his grandson. Provider notified and they spoke with patient but he was insisting on leaving

## 2021-07-01 NOTE — Progress Notes (Signed)
ReDS Vest / Clip - 07/01/21 0900       ReDS Vest / Clip   Station Marker C    Ruler Value 27    ReDS Value Range High volume overload    ReDS Actual Value 48

## 2021-07-04 ENCOUNTER — Encounter (HOSPITAL_COMMUNITY): Payer: Self-pay

## 2021-07-07 ENCOUNTER — Telehealth (HOSPITAL_COMMUNITY): Payer: Self-pay | Admitting: *Deleted

## 2021-07-07 NOTE — Telephone Encounter (Signed)
Pt called requesting to be direct admitted. Pt said he is short of breath and feels like he has a lot of fluid he needs to get off. Per Melany Guernsey Dr.Bensimhon says see if we can put him on list for direct admit. If he cannot wait, he will need to go to ED. Pt aware and agreeable with plan. Bed requested.

## 2021-07-08 ENCOUNTER — Inpatient Hospital Stay (HOSPITAL_COMMUNITY): Payer: Medicaid Other

## 2021-07-08 ENCOUNTER — Inpatient Hospital Stay: Payer: Self-pay

## 2021-07-08 ENCOUNTER — Inpatient Hospital Stay (HOSPITAL_COMMUNITY)
Admission: AD | Admit: 2021-07-08 | Discharge: 2021-07-09 | DRG: 291 | Discharge status: Against Medical Advice | Payer: Medicaid Other | Source: Ambulatory Visit | Attending: Internal Medicine | Admitting: Internal Medicine

## 2021-07-08 DIAGNOSIS — I340 Nonrheumatic mitral (valve) insufficiency: Secondary | ICD-10-CM | POA: Diagnosis present

## 2021-07-08 DIAGNOSIS — Z515 Encounter for palliative care: Secondary | ICD-10-CM

## 2021-07-08 DIAGNOSIS — F17210 Nicotine dependence, cigarettes, uncomplicated: Secondary | ICD-10-CM | POA: Diagnosis present

## 2021-07-08 DIAGNOSIS — Z7901 Long term (current) use of anticoagulants: Secondary | ICD-10-CM

## 2021-07-08 DIAGNOSIS — Z8249 Family history of ischemic heart disease and other diseases of the circulatory system: Secondary | ICD-10-CM

## 2021-07-08 DIAGNOSIS — F319 Bipolar disorder, unspecified: Secondary | ICD-10-CM | POA: Diagnosis present

## 2021-07-08 DIAGNOSIS — R0602 Shortness of breath: Secondary | ICD-10-CM | POA: Diagnosis not present

## 2021-07-08 DIAGNOSIS — I509 Heart failure, unspecified: Secondary | ICD-10-CM

## 2021-07-08 DIAGNOSIS — I110 Hypertensive heart disease with heart failure: Secondary | ICD-10-CM | POA: Diagnosis present

## 2021-07-08 DIAGNOSIS — Z20822 Contact with and (suspected) exposure to covid-19: Secondary | ICD-10-CM | POA: Diagnosis present

## 2021-07-08 DIAGNOSIS — Z66 Do not resuscitate: Secondary | ICD-10-CM | POA: Diagnosis not present

## 2021-07-08 DIAGNOSIS — I5043 Acute on chronic combined systolic (congestive) and diastolic (congestive) heart failure: Secondary | ICD-10-CM | POA: Diagnosis present

## 2021-07-08 DIAGNOSIS — R570 Cardiogenic shock: Secondary | ICD-10-CM | POA: Diagnosis present

## 2021-07-08 DIAGNOSIS — I5023 Acute on chronic systolic (congestive) heart failure: Secondary | ICD-10-CM | POA: Diagnosis not present

## 2021-07-08 DIAGNOSIS — I420 Dilated cardiomyopathy: Secondary | ICD-10-CM | POA: Diagnosis present

## 2021-07-08 DIAGNOSIS — F1590 Other stimulant use, unspecified, uncomplicated: Secondary | ICD-10-CM | POA: Diagnosis present

## 2021-07-08 DIAGNOSIS — Z79899 Other long term (current) drug therapy: Secondary | ICD-10-CM

## 2021-07-08 DIAGNOSIS — Z609 Problem related to social environment, unspecified: Secondary | ICD-10-CM | POA: Diagnosis present

## 2021-07-08 DIAGNOSIS — F1290 Cannabis use, unspecified, uncomplicated: Secondary | ICD-10-CM | POA: Diagnosis present

## 2021-07-08 DIAGNOSIS — Z5329 Procedure and treatment not carried out because of patient's decision for other reasons: Secondary | ICD-10-CM | POA: Diagnosis present

## 2021-07-08 DIAGNOSIS — I5021 Acute systolic (congestive) heart failure: Secondary | ICD-10-CM | POA: Diagnosis not present

## 2021-07-08 DIAGNOSIS — I5084 End stage heart failure: Secondary | ICD-10-CM | POA: Diagnosis present

## 2021-07-08 LAB — CBC
HCT: 37.5 % — ABNORMAL LOW (ref 39.0–52.0)
Hemoglobin: 12.5 g/dL — ABNORMAL LOW (ref 13.0–17.0)
MCH: 30.1 pg (ref 26.0–34.0)
MCHC: 33.3 g/dL (ref 30.0–36.0)
MCV: 90.4 fL (ref 80.0–100.0)
Platelets: 229 10*3/uL (ref 150–400)
RBC: 4.15 MIL/uL — ABNORMAL LOW (ref 4.22–5.81)
RDW: 13.2 % (ref 11.5–15.5)
WBC: 12.1 10*3/uL — ABNORMAL HIGH (ref 4.0–10.5)
nRBC: 0 % (ref 0.0–0.2)

## 2021-07-08 LAB — RESP PANEL BY RT-PCR (FLU A&B, COVID) ARPGX2
Influenza A by PCR: NEGATIVE
Influenza B by PCR: NEGATIVE
SARS Coronavirus 2 by RT PCR: NEGATIVE

## 2021-07-08 LAB — COMPREHENSIVE METABOLIC PANEL
ALT: 41 U/L (ref 0–44)
AST: 32 U/L (ref 15–41)
Albumin: 3.8 g/dL (ref 3.5–5.0)
Alkaline Phosphatase: 106 U/L (ref 38–126)
Anion gap: 11 (ref 5–15)
BUN: 25 mg/dL — ABNORMAL HIGH (ref 6–20)
CO2: 29 mmol/L (ref 22–32)
Calcium: 9.1 mg/dL (ref 8.9–10.3)
Chloride: 98 mmol/L (ref 98–111)
Creatinine, Ser: 1.66 mg/dL — ABNORMAL HIGH (ref 0.61–1.24)
GFR, Estimated: 50 mL/min — ABNORMAL LOW (ref >60)
Glucose, Bld: 160 mg/dL — ABNORMAL HIGH (ref 70–99)
Potassium: 2.9 mmol/L — ABNORMAL LOW (ref 3.5–5.1)
Sodium: 138 mmol/L (ref 135–145)
Total Bilirubin: 0.8 mg/dL (ref 0.3–1.2)
Total Protein: 6.6 g/dL (ref 6.5–8.1)

## 2021-07-08 LAB — DIGOXIN LEVEL: Digoxin Level: 0.5 ng/mL — ABNORMAL LOW (ref 0.8–2.0)

## 2021-07-08 LAB — HEPARIN LEVEL (UNFRACTIONATED): Heparin Unfractionated: >1.1 IU/mL — ABNORMAL HIGH (ref 0.30–0.70)

## 2021-07-08 LAB — COOXEMETRY PANEL
Carboxyhemoglobin: 1.3 % (ref 0.5–1.5)
Methemoglobin: 0.9 % (ref 0.0–1.5)
O2 Saturation: 47.2 %
Total hemoglobin: 13.1 g/dL (ref 12.0–16.0)

## 2021-07-08 LAB — RAPID URINE DRUG SCREEN, HOSP PERFORMED
Amphetamines: POSITIVE — AB
Barbiturates: NOT DETECTED
Benzodiazepines: NOT DETECTED
Cocaine: POSITIVE — AB
Opiates: NOT DETECTED
Tetrahydrocannabinol: POSITIVE — AB

## 2021-07-08 LAB — TSH: TSH: 0.908 u[IU]/mL (ref 0.350–4.500)

## 2021-07-08 LAB — MAGNESIUM: Magnesium: 2.3 mg/dL (ref 1.7–2.4)

## 2021-07-08 LAB — APTT: aPTT: 38 seconds — ABNORMAL HIGH (ref 24–36)

## 2021-07-08 LAB — BRAIN NATRIURETIC PEPTIDE: B Natriuretic Peptide: 3065.4 pg/mL — ABNORMAL HIGH (ref 0.0–100.0)

## 2021-07-08 LAB — TROPONIN I (HIGH SENSITIVITY)
Troponin I (High Sensitivity): 34 ng/L — ABNORMAL HIGH (ref <18)
Troponin I (High Sensitivity): 35 ng/L — ABNORMAL HIGH (ref <18)

## 2021-07-08 LAB — HIV ANTIBODY (ROUTINE TESTING W REFLEX): HIV Screen 4th Generation wRfx: NONREACTIVE

## 2021-07-08 LAB — LACTIC ACID, PLASMA: Lactic Acid, Venous: 2.5 mmol/L (ref 0.5–1.9)

## 2021-07-08 MED ORDER — CHLORHEXIDINE GLUCONATE CLOTH 2 % EX PADS
6.0000 | MEDICATED_PAD | Freq: Every day | CUTANEOUS | Status: DC
  Administered 2021-07-09 (×2): 6 via TOPICAL

## 2021-07-08 MED ORDER — SODIUM CHLORIDE 0.9 % IV SOLN: 250.0000 mL | INTRAVENOUS | Status: DC | PRN

## 2021-07-08 MED ORDER — ATORVASTATIN CALCIUM 10 MG PO TABS
20.0000 mg | ORAL_TABLET | Freq: Every day | ORAL | Status: DC
  Administered 2021-07-09: 20 mg via ORAL
  Filled 2021-07-08: qty 2

## 2021-07-08 MED ORDER — SPIRONOLACTONE 25 MG PO TABS
25.0000 mg | ORAL_TABLET | Freq: Every morning | ORAL | Status: DC
  Administered 2021-07-09: 25 mg via ORAL
  Filled 2021-07-08: qty 1

## 2021-07-08 MED ORDER — SODIUM CHLORIDE 0.9% FLUSH: 10.0000 mL | INTRAVENOUS | Status: DC | PRN

## 2021-07-08 MED ORDER — SODIUM CHLORIDE 0.9% FLUSH
3.0000 mL | Freq: Two times a day (BID) | INTRAVENOUS | Status: DC
  Administered 2021-07-08 – 2021-07-09 (×2): 3 mL via INTRAVENOUS

## 2021-07-08 MED ORDER — POTASSIUM CHLORIDE CRYS ER 20 MEQ PO TBCR
40.0000 meq | EXTENDED_RELEASE_TABLET | ORAL | Status: AC
  Administered 2021-07-08 (×2): 40 meq via ORAL
  Filled 2021-07-08 (×2): qty 2

## 2021-07-08 MED ORDER — ACETAMINOPHEN 325 MG PO TABS
650.0000 mg | ORAL_TABLET | ORAL | Status: DC | PRN
  Filled 2021-07-08: qty 2

## 2021-07-08 MED ORDER — POTASSIUM CHLORIDE CRYS ER 20 MEQ PO TBCR
40.0000 meq | EXTENDED_RELEASE_TABLET | Freq: Three times a day (TID) | ORAL | Status: DC
  Administered 2021-07-09: 40 meq via ORAL
  Filled 2021-07-08: qty 2

## 2021-07-08 MED ORDER — FUROSEMIDE 10 MG/ML IJ SOLN
80.0000 mg | Freq: Two times a day (BID) | INTRAMUSCULAR | Status: DC
  Administered 2021-07-08 – 2021-07-09 (×2): 80 mg via INTRAVENOUS
  Filled 2021-07-08 (×2): qty 8

## 2021-07-08 MED ORDER — DIGOXIN 125 MCG PO TABS
0.1250 mg | ORAL_TABLET | Freq: Every morning | ORAL | Status: DC
  Administered 2021-07-09: 0.125 mg via ORAL
  Filled 2021-07-08: qty 1

## 2021-07-08 MED ORDER — POTASSIUM CHLORIDE CRYS ER 20 MEQ PO TBCR
40.0000 meq | EXTENDED_RELEASE_TABLET | Freq: Three times a day (TID) | ORAL | Status: DC
  Administered 2021-07-08: 40 meq via ORAL
  Filled 2021-07-08: qty 2

## 2021-07-08 MED ORDER — SODIUM CHLORIDE 0.9% FLUSH: 3.0000 mL | INTRAVENOUS | Status: DC | PRN

## 2021-07-08 MED ORDER — SODIUM CHLORIDE 0.9% FLUSH
10.0000 mL | Freq: Two times a day (BID) | INTRAVENOUS | Status: DC
  Administered 2021-07-08: 10 mL

## 2021-07-08 MED ORDER — EMPAGLIFLOZIN 10 MG PO TABS
10.0000 mg | ORAL_TABLET | Freq: Every day | ORAL | Status: DC
  Administered 2021-07-09: 10 mg via ORAL
  Filled 2021-07-08: qty 1

## 2021-07-08 MED ORDER — MILRINONE LACTATE IN DEXTROSE 20-5 MG/100ML-% IV SOLN
0.3750 ug/kg/min | INTRAVENOUS | Status: DC
  Administered 2021-07-08: 19:00:00 0.25 ug/kg/min via INTRAVENOUS
  Filled 2021-07-08 (×2): qty 100

## 2021-07-08 NOTE — Progress Notes (Signed)
Heart Failure Navigator Progress Note  Assessed for Heart & Vascular TOC clinic readiness.  Patient does not meet criteria due to direct admit by AHF clinic, Dr. Haroldine Laws. Established patient with AHF clinic prior to hospitalization.   Navigator available for educational resources.   Pricilla Holm, MSN, RN Heart Failure Nurse Navigator [phone]

## 2021-07-08 NOTE — Progress Notes (Signed)
ANTICOAGULATION CONSULT NOTE - Initial Consult  Pharmacy Consult for Heparin  Indication:  h/o LV thrombus  Allergies  Allergen Reactions   Tape Itching and Rash    Adhesive tape    Patient Measurements: Height: 5\' 8"  (172.7 cm) Weight: 73.6 kg (162 lb 4.1 oz) IBW/kg (Calculated) : 68.4 Heparin Dosing Weight: 73.6 kg (TBW)  Vital Signs: Temp: 98.1 F (36.7 C) (01/31 1339) Temp Source: Oral (01/31 1339) BP: 110/85 (01/31 1339) Pulse Rate: 102 (01/31 1339)  Labs: Recent Labs    07/08/21 1422  HGB 12.5*  HCT 37.5*  PLT 229    Estimated Creatinine Clearance: 67.9 mL/min (A) (by C-G formula based on SCr of 1.26 mg/dL (H)).   Medical History: Past Medical History:  Diagnosis Date   CHF (congestive heart failure) (HCC)    Dental infection    Enlarged heart    Hernia, inguinal, right    Hypertension     Medications:  Patient reports he last took Xarelto on 1/31 at 0900 AM on day of admit.   See medication recondilation list  Assessment: 51 y.o male on Xarelto 20mg  daily PTA for h/o LV thrombus.  Xarelto on hold  and pharmacy consulted to dose IV heparin  drip in case need for procedures. History of medication noncompliance noted.  Patient reports he last took Xarelto this morning ~ 0900 and that he has taken Xarelto daily for at least the last 2 weeks. Baseline PTT and heparin level ordered.  Heparin drip , no bolus, will start tomorrow AM , 24 hours after last Xaretlo dose. We will need to monitor heparin using aPTTs due to effect of Xarelto on heparin levels, until HL correlate with aPTTs.    Goal of Therapy:  Heparin level 0.3-0.7 units/ml aPTT 66-102  seconds Monitor platelets by anticoagulation protocol: Yes   Plan:   Checked baseline aPTT and Heparin level  today. (PTT = 38, HL =>1.1 . HL elevated due to recent  Xarelto) Tomorrow Wed 07/09/21 at 0900 AM , start IV heparin drip at rate of 1200 units/hr, no bolus. Check aPTT/HL  6 hours after heparin  started.  Daily aPTT and HL , Will adjust heparin rate based on aPTT until HL correlates with PTT and daily CBC.   Thank you for allowing pharmacy to be part of this patients care team.  , RPh Clinical Pharmacist [phone] 07/08/2021,3:30 PM Please check AMION for all [facility] Pharmacy phone numbers After 10:00 PM, call Main Pharmacy [phone]

## 2021-07-08 NOTE — H&P (Addendum)
Advanced Heart Failure Team History and Physical Note   PCP:  Patient, No Pcp Per (Inactive)  PCP-Cardiology: Dr. Haroldine Laws   Reason for Admission: Acute on Chronic Systolic Heart Failure    HPI:    Paul Mccall is a 51 y.o. male with chronic systolic HF due to NICM, HTN, bipolar disorder, h/o cocaine abuse, tobacco abuse, and medication noncompliance.    He was seen in [facility] ED 7 times in 2019 with CP +/- volume overload associated with medication noncompliance and ongoing cocaine use. Typically given IV lasix with improvement.    Admitted (12/19) to [facility] with ADHF. He had not been taking medications due to confusion and cost. He had used cocaine within the week. EKG showed new LBBB at that time. Troponin was negative. Thought to be secondary to worsening CM and not ischemia.    Admitted 1/20 for ADHF w/ low output and required milrinone. EF 15-20%, grade 3 DD, 2.6 cm mobile thrombus at apical lateral myocardium, moderate MR. Started on Coumadin w/ heparin bridge. Weaned off milrinone w/ marginal co-ox ~64% but not felt to be a candidate for home inotrope's nor advanced therapies due to drug abuse. Placed on GDMT and discharged, but failed to f/u in the [facility]. He has however been followed by [facility] and sees Dr. Minette Brine.   Admitted to [facility] 2/20, he was also incarcerated x 4 months and several of his meds were changed. Since being released from prison, he denies any further cocaine use.    Admitted to [facility] 4/21 for ADHF left AMA after 3 days. Reports he wanted to come back to [facility] for a second opinion and further care.   2021 ECHO showed LVEF 15%. RV systolic function mildly reduced.    He was seen in the HF clinic 10/08/20. Started on Jardiance and torsemide increased to 100 mg bid.     In August 2022 he was set up for [facility] and cancelled 3 times.    Follow up 11/22, he was stressed due to family issues. Out of [place], [place] and digoxin. Meds refilled and  referred to [facility].  Seen in the [facility] last week on 1/24 (had no-showed prior 2 appts). Complained of SOB with minimal activity, worse over past 1-2 weeks. Reported being back on his meds, he had missed a couple days of torsemide the week prior due to pharmacy mix up. Had also ran out of metolazone. Still smoking cigarettes + occasional THC use but denied cocaine use in 2 years. ReDs clip was elevated at 48%. BP soft.  It was recommended he be direct admitted for IV diuretics and possible inotropic support, however pt declined, opting to try escalation of home diuretics first. He was given dose of IV Lasix + metolazone in clinic and instructed to take additional metolazone the next day + torsemide 100 bid. Entresto discontinued w/ low BP.   Symptoms failed to improved. Continues w/ dyspnea, orthopnea/PND, chest pressure and fluid overload. He is now agreeable to admission for IV Lasix and possible inotropic support.   VS stable on admit, BP 110/85. Tele, sinus tach low 100s. Afebrile. Labs pending.   Review of Systems: [y] = yes, [ ]  = no   General: Weight gain [ ] ; Weight loss [ ] ; Anorexia [ ] ; Fatigue [ Y]; Fever [ ] ; Chills [ ] ; Weakness [ ]   Cardiac: Chest pain/pressure [Y ]; Resting SOB [ Y]; Exertional SOB [Y ]; Orthopnea [ Y]; Pedal Edema [ Y];  Palpitations [ ] ; Syncope [ ] ; Presyncope [ ] ; Paroxysmal nocturnal dyspnea[Y ]  Pulmonary: Cough [ ] ; Wheezing[ ] ; Hemoptysis[ ] ; Sputum [ ] ; Snoring [ ]   GI: Vomiting[ ] ; Dysphagia[ ] ; Melena[ ] ; Hematochezia [ ] ; Heartburn[ ] ; Abdominal pain [ ] ; Constipation [ ] ; Diarrhea [ ] ; BRBPR [ ]   GU: Hematuria[ ] ; Dysuria [ ] ; Nocturia[ ]   Vascular: Pain in legs with walking [ ] ; Pain in feet with lying flat [ ] ; Non-healing sores [ ] ; Stroke [ ] ; TIA [ ] ; Slurred speech [ ] ;  Neuro: Headaches[ ] ; Vertigo[ ] ; Seizures[ ] ; Paresthesias[ ] ;Blurred vision [ ] ; Diplopia [ ] ; Vision changes [ ]   Ortho/Skin: Arthritis [ ] ; Joint pain [ ] ; Muscle  pain [ ] ; Joint swelling [ ] ; Back Pain [ ] ; Rash [ ]   Psych: Depression[ ] ; Anxiety[ ]   Heme: Bleeding problems [ ] ; Clotting disorders [ ] ; Anemia [ ]   Endocrine: Diabetes [ ] ; Thyroid dysfunction[ ]    Home Medications Prior to Admission medications   Medication Sig Start Date End Date Taking? Authorizing Provider  atorvastatin (LIPITOR) 20 MG tablet Take 1 tablet (20 mg total) by mouth daily. 04/24/21   Clegg, Amy D, NP  digoxin (LANOXIN) 0.125 MG tablet Take 1 tablet (0.125 mg total) by mouth in the morning. 04/24/21   Clegg, Amy D, NP  empagliflozin (JARDIANCE) 10 MG TABS tablet Take 1 tablet (10 mg total) by mouth daily before breakfast. 04/24/21   Clegg, Amy D, NP  ibuprofen (ADVIL) 200 MG tablet Take 400 mg by mouth daily as needed for headache.    [provider]  metolazone (ZAROXOLYN) 2.5 MG tablet Take 1 tablet (2.5 mg total) by mouth 2 (two) times a week. Every Monday and Wednesday Patient not taking: Reported on 07/01/2021 01/09/21   Keagon Glascoe, Shaune Pascal, MD  naproxen sodium (ALEVE) 220 MG tablet Take 220 mg by mouth daily as needed (pain.).    [provider]  Potassium Chloride (KLOR-CON PO) Take 10 mg by mouth. Patient takes 2 tablets in the morning and evening.    [provider]  rivaroxaban (XARELTO) 20 MG TABS tablet Take 1 tablet (20 mg total) by mouth daily with supper. 01/08/21   Devony Mcgrady, Shaune Pascal, MD  sacubitril-valsartan (ENTRESTO) 24-26 MG Take 1 tablet by mouth 2 (two) times daily. 04/24/21   Clegg, Amy D, NP  spironolactone (ALDACTONE) 25 MG tablet Take 1 tablet (25 mg total) by mouth in the morning. 01/14/21   Marciel Offenberger, Shaune Pascal, MD  torsemide (DEMADEX) 20 MG tablet Take 5 tablets (100 mg total) by mouth 2 (two) times daily. 06/24/21   Gayanne Prescott, Shaune Pascal, MD    Past Medical History: Past Medical History:  Diagnosis Date   CHF (congestive heart failure) ([place])    Dental infection    Enlarged heart    Hernia, inguinal, right     Hypertension     Past Surgical History: No past surgical history on file.  Family History:  Family History  Problem Relation Age of Onset   CAD Father    Heart failure Father     Social History: Social History   Socioeconomic History   Marital status: Significant Other    Spouse name: Not on file   Number of children: 2   Years of education: Not on file   Highest education level: Not on file  Occupational History   Not on file  Tobacco Use   Smoking status: Every Day   Smokeless tobacco:  Never  Substance and Sexual Activity   Alcohol use: Yes    Comment: occasionally   Drug use: Not on file   Sexual activity: Not on file  Other Topics Concern   Not on file  Social History Narrative   07/19/18- Pt transportation issues due to inability to afford gas- has car but does not have job- disability application to be completed with [employer].  Pt also reported food insecurity- food resources provided.   Social Determinants of Health   Financial Resource Strain: Not on file  Food Insecurity: Not on file  Transportation Needs: No Transportation Needs   Lack of Transportation (Medical): No   Lack of Transportation (Non-Medical): No  Physical Activity: Not on file  Stress: Not on file  Social Connections: Not on file    Allergies:  Allergies  Allergen Reactions   Tape Itching and Rash    Adhesive tape    Objective:    Vital Signs:   Temp:  [98.1 F (36.7 C)] 98.1 F (36.7 C) (01/31 1339) Pulse Rate:  [102] 102 (01/31 1339) Resp:  [18] 18 (01/31 1339) BP: (110)/(85) 110/85 (01/31 1339) Weight:  [73.6 kg] 73.6 kg (01/31 1339) Last BM Date: 07/07/21 Filed Weights   07/08/21 1339  Weight: 73.6 kg     Physical Exam     General:  fatigued appearing. No respiratory difficulty HEENT: Normal Neck: Supple. JVD 14 cm Carotids 2+ bilat; no bruits. No lymphadenopathy or thyromegaly appreciated. Cor: PMI nondisplaced. Regular rhythm tachy rate. 3/6 MR murmur at  apex  Lungs: decreased BS bilaterally w/ faint bibasilar crackles R>L  Abdomen: Soft, mild diffuse tenderness, mildly distended. No hepatosplenomegaly. No bruits or masses. Good bowel sounds. Extremities: No cyanosis, clubbing, rash, 1 + b/l ankle edema Neuro: Alert & oriented x 3, cranial nerves grossly intact. moves all 4 extremities w/o difficulty. Affect pleasant.   Telemetry   NSR/ST, 90s-low 100s   EKG   12 lead pending   Labs     Basic Metabolic Panel: No results for input(s): NA, K, CL, CO2, GLUCOSE, BUN, CREATININE, CALCIUM, MG, PHOS in the last 168 hours.  Liver Function Tests: No results for input(s): AST, ALT, ALKPHOS, BILITOT, PROT, ALBUMIN in the last 168 hours. No results for input(s): LIPASE, AMYLASE in the last 168 hours. No results for input(s): AMMONIA in the last 168 hours.  CBC: No results for input(s): WBC, NEUTROABS, HGB, HCT, MCV, PLT in the last 168 hours.  Cardiac Enzymes: No results for input(s): CKTOTAL, CKMB, CKMBINDEX, TROPONINI in the last 168 hours.  BNP: BNP (last 3 results) Recent Labs    10/08/20 1303 07/01/21 0952  BNP 446.5* 1,488.7*    ProBNP (last 3 results) No results for input(s): PROBNP in the last 8760 hours.   CBG: No results for input(s): GLUCAP in the last 168 hours.  Coagulation Studies: No results for input(s): LABPROT, INR in the last 72 hours.  Imaging: No results found.   Patient Profile   51 y.o. male with chronic systolic HF due to NICM, HTN, bipolar disorder, h/o cocaine abuse, tobacco abuse, and medication noncompliance, direct admitted for a/c CHF NYHA Class IIIb- IV symptoms.    Assessment/Plan   1. Acute on Chronic Combined Systolic and Diastolic Heart Failure  - Chronic systolic HF due to NICM from cocaine abuse.   - LHC 2018 no significant coronary disease.  - Echo 09/2017: EF 30-35%.  - Echo 2/20: EF 15-20%. RHC (06/09/18) with CO 4.1, CI 2.24.  -  Echo 07/14/18: EF 15-20%, dilated CM,  anteroseptal, inferoseptal, and inferior akinesis. Otherwise severe global hypokinesis, grade 3 DD, 2.6 cm mobile thrombus at apical lateral myocardium, severely dilated LA, mildly dilated RA, moderate MR, mild calcification of aortic valve. RV mild to moderate HK.   - Echo repeated at [facility] 08/2019: EF 15%. RV systolic function mildly reduced - He has cancelled RHC 3 times.   - Now w/ a/c CHF NYHA IIIb- early IV + Fluid overload  - Start IV Lasix 80 mg bid - May need inotropic support w/ milrinone if poor response - Place TED hoses  - Repeat Echo  - Hold Entresto with low BP.  - Hold ? blocker w/ suspected low output  - Continue Jardiance 10 mg daily.   - Continue spiro 25 mg daily.  - Continue digoxin 0.125 mg daily. Check Dig level  - Not a candidate for advanced therapies with non-compliance.   2. Prior h/o cocaine use - Reports Last used 03/2019. - UDS at [facility] 09/2019 negative (see in Care Everywhere). - Check UDS    3. Tobacco Abuse - Discussed tobacco cessation.    4. H/o LV Thrombus: - Noted on echo 07/2018 - Echo repeated at [facility] 3/21 and not visualized - on Xarelto 20 mg daily. Will hold and cover w/ heparin in case need for procedures   5. Mitral Regurgitation  - likely functional, moderate on previous echo - repeat echo   6. Social: - He has been approved for disability.  - Living in motel and in his boss's car. - He was referred to [facility] in [place] but ultimately changed his mind. - Consult HF TOC/SW team      Lyda Jester, PA-C 07/08/2021, 1:54 PM  Advanced Heart Failure Team Pager [phone] (M-F; 7a - 5p)  Please contact [facility] for night-coverage after hours (4p -7a ) and weekends on [email or web address]  Patient seen and examined with the above-signed Advanced Practice Provider and/or Housestaff. I personally reviewed laboratory data, imaging studies and relevant notes. I independently examined the patient and formulated the important aspects of  the plan. I have edited the note to reflect any of my changes or salient points. I have personally discussed the plan with the patient and/or family.  51 y/o male with severe NICM direct admitted today for progressive HF symptoms NYHA IV with progressive volume overload not responding to increasing oral diuretics  General:  Lying in bed. Lethargic  No resp difficulty HEENT: normal Neck: supple. JVP to jaw Carotids 2+ bilat; no bruits. No lymphadenopathy or thryomegaly appreciated. Cor: PMI nondisplaced. Regular rate & rhythm. No rubs, gallops or murmurs. Lungs: clear Abdomen: soft, nontender, nondistended. No hepatosplenomegaly. No bruits or masses. Good bowel sounds. Extremities: no cyanosis, clubbing, rash, 2+ edema Cool Neuro: alert & orientedx3, cranial nerves grossly intact. moves all 4 extremities w/o difficulty. Affect pleasant  He appears low output. Will place PICC and check CVP and co-ox. Almost certainly will need inotropic support. Options for advanced therapies may be limited due to social issues and problems with f/u. Very difficult situation.   Glori Bickers, MD  6:22 PM

## 2021-07-08 NOTE — TOC Progression Note (Signed)
Transition of Care [facility]) - Progression Note    Patient Details  Name: Maclin Crookshanks MRN: [id number] Date of Birth: [birth year]  Transition of Care [facility]) CM/SW Contact  Zenon Mayo, RN Phone Number: 07/08/2021, 1:32 PM  Clinical Narrative:     Transition of Care [facility]) Screening Note   Patient Details  Name: Yussef Vonasek Date of Birth: November 12, 1970   Transition of Care [facility]) CM/SW Contact:    Zenon Mayo, RN Phone Number: 07/08/2021, 1:32 PM    Transition of Care Department [facility]) has reviewed patient and no TOC needs have been identified at this time. We will continue to monitor patient advancement through interdisciplinary progression rounds. If new patient transition needs arise, please place a TOC consult.          Expected Discharge Plan and Services                                                 Social Determinants of Health (SDOH) Interventions    Readmission Risk Interventions No flowsheet data found.

## 2021-07-09 ENCOUNTER — Inpatient Hospital Stay (HOSPITAL_COMMUNITY): Payer: Medicaid Other

## 2021-07-09 DIAGNOSIS — R0602 Shortness of breath: Secondary | ICD-10-CM

## 2021-07-09 DIAGNOSIS — I5021 Acute systolic (congestive) heart failure: Secondary | ICD-10-CM | POA: Diagnosis not present

## 2021-07-09 DIAGNOSIS — Z66 Do not resuscitate: Secondary | ICD-10-CM

## 2021-07-09 DIAGNOSIS — Z515 Encounter for palliative care: Secondary | ICD-10-CM

## 2021-07-09 LAB — COOXEMETRY PANEL
Carboxyhemoglobin: 1.2 % (ref 0.5–1.5)
Carboxyhemoglobin: 1.2 % (ref 0.5–1.5)
Methemoglobin: 1.3 % (ref 0.0–1.5)
Methemoglobin: 1.2 % (ref 0.0–1.5)
O2 Saturation: 38.3 %
O2 Saturation: 51.1 %
Total hemoglobin: 13.3 g/dL (ref 12.0–16.0)
Total hemoglobin: 12.6 g/dL (ref 12.0–16.0)

## 2021-07-09 LAB — CBC
HCT: 39 % (ref 39.0–52.0)
Hemoglobin: 13.2 g/dL (ref 13.0–17.0)
MCH: 30.8 pg (ref 26.0–34.0)
MCHC: 33.8 g/dL (ref 30.0–36.0)
MCV: 90.9 fL (ref 80.0–100.0)
Platelets: 233 10*3/uL (ref 150–400)
RBC: 4.29 MIL/uL (ref 4.22–5.81)
RDW: 13.2 % (ref 11.5–15.5)
WBC: 15.7 10*3/uL — ABNORMAL HIGH (ref 4.0–10.5)
nRBC: 0 % (ref 0.0–0.2)

## 2021-07-09 LAB — ECHOCARDIOGRAM COMPLETE
AR max vel: 1.18 cm2
AV Area VTI: 1.07 cm2
AV Area mean vel: 1.15 cm2
AV Mean grad: 4 mmHg
AV Peak grad: 6.8 mmHg
Ao pk vel: 1.3 m/s
Calc EF: 23.4 %
Height: 68 in
MV VTI: 0.15 cm2
S' Lateral: 6.7 cm
Single Plane A2C EF: 17.4 %
Single Plane A4C EF: 29.2 %
Weight: 2627.2 oz

## 2021-07-09 LAB — LACTIC ACID, PLASMA: Lactic Acid, Venous: 2.6 mmol/L (ref 0.5–1.9)

## 2021-07-09 LAB — BASIC METABOLIC PANEL
Anion gap: 11 (ref 5–15)
BUN: 23 mg/dL — ABNORMAL HIGH (ref 6–20)
CO2: 28 mmol/L (ref 22–32)
Calcium: 9.4 mg/dL (ref 8.9–10.3)
Chloride: 98 mmol/L (ref 98–111)
Creatinine, Ser: 1.47 mg/dL — ABNORMAL HIGH (ref 0.61–1.24)
GFR, Estimated: 58 mL/min — ABNORMAL LOW (ref >60)
Glucose, Bld: 124 mg/dL — ABNORMAL HIGH (ref 70–99)
Potassium: 4.4 mmol/L (ref 3.5–5.1)
Sodium: 137 mmol/L (ref 135–145)

## 2021-07-09 MED ORDER — POTASSIUM CHLORIDE CRYS ER 20 MEQ PO TBCR: 40.0000 meq | EXTENDED_RELEASE_TABLET | Freq: Every day | ORAL | Status: DC

## 2021-07-09 MED ORDER — HEPARIN (PORCINE) 25000 UT/250ML-% IV SOLN
1200.0000 [IU]/h | INTRAVENOUS | Status: DC
  Administered 2021-07-09: 1200 [IU]/h via INTRAVENOUS
  Filled 2021-07-09: qty 250

## 2021-07-09 MED ORDER — LORAZEPAM 1 MG PO TABS
1.0000 mg | ORAL_TABLET | Freq: Four times a day (QID) | ORAL | Status: DC | PRN
  Administered 2021-07-09: 1 mg via ORAL
  Filled 2021-07-09: qty 1

## 2021-07-09 MED ORDER — TORSEMIDE 100 MG PO TABS
100.0000 mg | ORAL_TABLET | Freq: Two times a day (BID) | ORAL | Status: DC
Start: 2021-07-09 — End: 2021-07-09

## 2021-07-09 MED ORDER — RIVAROXABAN 20 MG PO TABS
20.0000 mg | ORAL_TABLET | Freq: Every day | ORAL | Status: DC
  Administered 2021-07-09: 20 mg via ORAL
  Filled 2021-07-09: qty 1

## 2021-07-09 NOTE — Progress Notes (Addendum)
[facility] 3E17 [facility]) Hospital Liaison Note   Received request from PMT provider, Virgia Land., NP, for hospice services at home after discharge. Patient in the process of being discharge when referral received and will be evaluated by [facility] once he returns home by [facility] team. Patient has not been approved for services and contact has not been made. Chart and patient information will be reviewed by [facility] physician. Hospice eligibility pending. ACC team will follow up with patient. TOC/[place] and Corrie Dandy L/NP aware of above updates.   Please call with any questions/concerns.    Thank you for the opportunity to participate in this patient's care.   Odette Fraction, MSW [facility] Liaison  [phone]

## 2021-07-09 NOTE — Plan of Care (Signed)
°  Problem: Education: °Goal: Ability to demonstrate management of disease process will improve °Outcome: Progressing °Goal: Ability to verbalize understanding of medication therapies will improve °Outcome: Progressing °Goal: Individualized Educational Video(s) °Outcome: Progressing °  °
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
## 2021-07-09 NOTE — TOC Initial Note (Signed)
Transition of Care [facility]) - Initial/Assessment Note    Patient Details  Name: Paul Mccall MRN: [id number] Date of Birth: Dec 03, 1970  Transition of Care [facility]) CM/SW Contact:    Kyesha Balla, LCSW Phone Number: 07/09/2021, 10:11 AM  Clinical Narrative:                 HF CSW spoke to Mr. Brigante at bedside and completed a very brief SDOH with the patient who denied having any needs at this time. Mr. Vancott reported that he gets Food Stamps he has transportation and that he was just approved for disability in December and is awaiting a check to come in the mail. Mr. Pankratz fell asleep and wasn't able to answer further questions asked by the CSW. CSW provided the patient with the social workers name and position and if anything changes to please reach out so that the CSW can provide support.  CSW will continue to follow throughout discharge.  Expected Discharge Plan: Home/Self Care Barriers to Discharge: Continued Medical Work up   Patient Goals and CMS Choice Patient states their goals for this hospitalization and ongoing recovery are:: to return home      Expected Discharge Plan and Services Expected Discharge Plan: Home/Self Care In-house Referral: Clinical Social Work Discharge Planning Services: CM Consult   Living arrangements for the past 2 months: Single Family Home                                      Prior Living Arrangements/Services Living arrangements for the past 2 months: Single Family Home Lives with:: Self Patient language and need for interpreter reviewed:: Yes Do you feel safe going back to the place where you live?: Yes      Need for Family Participation in Patient Care: No (Comment) Care giver support system in place?: No (comment)   Criminal Activity/Legal Involvement Pertinent to Current Situation/Hospitalization: No - Comment as needed  Activities of Daily Living      Permission Sought/Granted                  Emotional  Assessment Appearance:: Appears stated age Attitude/Demeanor/Rapport: Lethargic Affect (typically observed): Quiet, Flat Orientation: : Oriented to Self, Oriented to Place, Oriented to  Time, Oriented to Situation   Psych Involvement: No (comment)  Admission diagnosis:  CHF (congestive heart failure), NYHA class III, acute on chronic, systolic (HCC) [I50.23] Patient Active Problem List   Diagnosis Date Noted   CHF (congestive heart failure), NYHA class III, acute on chronic, systolic (HCC) 07/08/2021   Cocaine abuse (HCC)    Congestive heart failure (CHF) (HCC) 07/11/2018   PCP:  Patient, No Pcp Per (Inactive) Pharmacy:   [facility] [address] [place] - [address] [place] [address] Phone: [phone] Fax: [phone]     Social Determinants of Health (SDOH) Interventions Food Insecurity Interventions: Intervention Not Indicated, Other (Comment) (reports getting Food Stamps) Financial Strain Interventions: Intervention Not Indicated Housing Interventions: Intervention Not Indicated Transportation Interventions: Intervention Not Indicated  Readmission Risk Interventions No flowsheet data found.  Atira Borello, MSW, LCSW [phone] Heart Failure Social Worker

## 2021-07-09 NOTE — Progress Notes (Signed)
Paged Tonye Becket, NP with advanced heart failure team to let her know that pt would like to leave AMA. Heart failure team would like for pt to see hospice first so they can set up resources for him. Pt says that he must leave by 4 pm. Will page palliative care to  see pt.

## 2021-07-09 NOTE — Progress Notes (Signed)
This chaplain responded to PMT NP-Mary referral for spiritual care.   The Pt. accepted the chaplain's presence and reflective listening as the Pt. told the story of his faith. The chaplain affirmed the Pt. belief in God's everlasting love and forgiveness. The Pt. was able to identify the desired places of healing and growth in his story.   The chaplain and Pt. ended the visit with prayer and the hope in God's everlasting relationship with the Pt.   Chaplain Stephanie Acre [phone]

## 2021-07-09 NOTE — Progress Notes (Signed)
° °  Echocardiogram 2D Echocardiogram has been performed.  Paul Mccall 07/09/2021, 10:41 AM

## 2021-07-09 NOTE — Discharge Summary (Addendum)
Advanced Heart Failure Team  Discharge Summary   Patient ID: Paul Mccall MRN: [id number], DOB/AGE: 1971/04/14 51 y.o. Admit date: 07/08/2021 D/C date:     07/09/2021   Primary Discharge Diagnoses:  Acute on chronic systolic CHF Cardiogenic shock Polysubstance abuse History of LV thrombus Mitral regurgitation   Hospital Course:  Paul Mccall is a 51 y.o. male with chronic systolic HF due to NICM, HTN, bipolar disorder, h/o cocaine abuse, tobacco abuse, and medication noncompliance.    He was seen in [facility] 7 times in 2019 with CP +/- volume overload associated with medication noncompliance and ongoing cocaine use. Typically given IV lasix with improvement.    Admitted (12/19) to [facility] with ADHF. He had not been taking medications due to confusion and cost. He had used cocaine within the week. EKG showed new LBBB at that time. Troponin was negative. Thought to be secondary to worsening CM and not ischemia.    Admitted 1/20 for ADHF w/ low output and required milrinone. EF 15-20%, grade 3 DD, 2.6 cm mobile thrombus at apical lateral myocardium, moderate MR. Started on Coumadin w/ heparin bridge. Weaned off milrinone w/ marginal co-ox ~64% but not felt to be a candidate for home inotrope's nor advanced therapies due to drug abuse. Placed on GDMT and discharged, but failed to f/u in the [facility]. He has however been followed by [facility] and sees Dr. [name].   Admitted to [facility] 2/20, he was also incarcerated x 4 months and several of his meds were changed.    Admitted to [facility] 4/21 for ADHF left AMA after 3 days. Reports he wanted to come back to [facility] for a second opinion and further care.   2021 ECHO showed LVEF 15%. RV systolic function mildly reduced.    He was seen in the HF clinic 10/08/20. Started on Jardiance and torsemide increased to 100 mg bid.     In August 2022 he was set up for RHC and cancelled 3 times.    Follow up 11/22, he was stressed due to family  issues. Out of [place], [place] and digoxin. Meds refilled and referred to [facility].   Seen in the [facility] last week on 1/24 (had no-showed prior 2 appts). Complained of SOB with minimal activity, worse over past 1-2 weeks. Reported being back on his meds, he had missed a couple days of torsemide the week prior due to pharmacy mix up. Had also ran out of metolazone. ReDs clip was elevated at 48%. BP soft.  It was recommended he be direct admitted for IV diuretics and possible inotropic support, however pt declined, opting to try escalation of home diuretics first. Entresto discontinued w/ low BP.   Symptoms failed to improve with IV lasix in clinic + doses of metolazone. He was directed admitted on 07/08/21 for a/c CHF.  He was started on IV lasix. Lactic acid 2.5>2.6. Initial co-ox 47% and started on milrinone 0.25. Repeat co-ox this am low at 38%. Milrinone increased to 0.375. CVP 5-6 today and transitioned to po torsemide. Tox screen + for amphetamines/THC/cocaine. Not currently a candidate for advanced therapies d/t substance abuse. Patient wants to leave hospital today. Explained that he is in shock and he will likely become uncomfortable fairly quickly once milrinone is discontinued. He verbalized understanding and remained adamant he wanted to discharge today.   Palliative care consulted for GOC discussion. He was made a DNR and will discharge home with his ex wife on hospice.  Discharge Weight Range: 164>164 Discharge Vitals: Blood pressure 109/81, pulse (!) 101, temperature 98 F (36.7 C), temperature source Oral, resp. rate 16, height 5\' 8"  (1.727 m), weight 74.5 kg, SpO2 96 %.  Labs: Lab Results  Component Value Date   WBC 15.7 (H) 07/09/2021   HGB 13.2 07/09/2021   HCT 39.0 07/09/2021   MCV 90.9 07/09/2021   PLT 233 07/09/2021    Recent Labs  Lab 07/08/21 1422 07/09/21 0511  NA 138 137  K 2.9* 4.4  CL 98 98  CO2 29 28  BUN 25* 23*  CREATININE 1.66* 1.47*   CALCIUM 9.1 9.4  PROT 6.6  --   BILITOT 0.8  --   ALKPHOS 106  --   ALT 41  --   AST 32  --   GLUCOSE 160* 124*   No results found for: CHOL, HDL, LDLCALC, TRIG BNP (last 3 results) Recent Labs    10/08/20 1303 07/01/21 0952 07/08/21 1422  BNP 446.5* 1,488.7* 3,065.4*    ProBNP (last 3 results) No results for input(s): PROBNP in the last 8760 hours.   Diagnostic Studies/Procedures   DG Chest 2 View  Result Date: 07/08/2021 CLINICAL DATA:  Shortness of breath. EXAM: CHEST - 2 VIEW COMPARISON:  July 11, 2018. FINDINGS: Stable cardiomegaly. Both lungs are clear. The visualized skeletal structures are unremarkable. IMPRESSION: No active cardiopulmonary disease. Electronically Signed   By: Marijo Conception M.D.   On: 07/08/2021 14:53   ECHOCARDIOGRAM COMPLETE  Result Date: 07/09/2021    ECHOCARDIOGRAM REPORT   Patient Name:   Paul Mccall Date of Exam: 07/09/2021 Medical Rec #:  [id number]   Height:       68.0 in Accession #:    [id number]  Weight:       164.2 lb Date of Birth:  [birth year]  BSA:          1.879 m Patient Age:    9 years    BP:           113/92 mmHg Patient Gender: M           HR:           104 bpm. Exam Location:  Inpatient Procedure: 2D Echo, Cardiac Doppler and Color Doppler Indications:    I50.21 CHF  History:        Patient has prior history of Echocardiogram examinations, most                 recent 07/13/2018. CHF and Cardiomegaly; Risk                 Factors:Hypertension.  Sonographer:    Beryle Beams Referring Phys: [place]  1. Left ventricular ejection fraction, by estimation, is 20 to 25%. The left ventricle has severely decreased function. The left ventricle demonstrates regional wall motion abnormalities (see scoring diagram/findings for description). The left ventricular internal cavity size was severely dilated. There is mild asymmetric left ventricular hypertrophy of the posterior wall segment. Left ventricular diastolic function  could not be evaluated.  2. Right ventricular systolic function is normal. The right ventricular size is mildly enlarged.  3. Left atrial size was severely dilated.  4. Right atrial size was severely dilated.  5. The mitral valve is normal in structure. Moderate to severe mitral valve regurgitation. No evidence of mitral stenosis.  6. The aortic valve is tricuspid. Aortic valve regurgitation is not visualized. Aortic valve sclerosis/calcification is present, without any evidence of aortic  stenosis.  7. The inferior vena cava is normal in size with greater than 50% respiratory variability, suggesting right atrial pressure of 3 mmHg. Conclusion(s)/Recommendation(s): Findings consistent with Severe depressed ejection fraction, biatrial enlargement with secondary moderate to severe mitral regurgitation. FINDINGS  Left Ventricle: Left ventricular ejection fraction, by estimation, is 20 to 25%. The left ventricle has severely decreased function. The left ventricle demonstrates regional wall motion abnormalities. The left ventricular internal cavity size was severely dilated. There is mild asymmetric left ventricular hypertrophy of the posterior wall segment. Left ventricular diastolic function could not be evaluated due to mitral regurgitation (moderate or greater). Left ventricular diastolic function could  not be evaluated.  LV Wall Scoring: The anterior septum, inferior wall, mid inferoseptal segment, and basal inferoseptal segment are akinetic. The entire anterior wall, entire lateral wall, and entire apex are hypokinetic. Right Ventricle: The right ventricular size is mildly enlarged. No increase in right ventricular wall thickness. Right ventricular systolic function is normal. The tricuspid regurgitant velocity is 2.86 m/s, and with an assumed right atrial pressure of 3  mmHg, the estimated right ventricular systolic pressure is 123XX123 mmHg. Left Atrium: Left atrial size was severely dilated. Right Atrium: Right  atrial size was severely dilated. Pericardium: There is no evidence of pericardial effusion. Mitral Valve: The mitral valve is normal in structure. Moderate to severe mitral valve regurgitation. No evidence of mitral valve stenosis. MV peak gradient, 83.5 mmHg. The mean mitral valve gradient is 56.0 mmHg. Tricuspid Valve: The tricuspid valve is normal in structure. Tricuspid valve regurgitation is mild . No evidence of tricuspid stenosis. Aortic Valve: The aortic valve is tricuspid. Aortic valve regurgitation is not visualized. Aortic valve sclerosis/calcification is present, without any evidence of aortic stenosis. Aortic valve mean gradient measures 4.0 mmHg. Aortic valve peak gradient measures 6.8 mmHg. Aortic valve area, by VTI measures 1.07 cm. Pulmonic Valve: The pulmonic valve was normal in structure. Pulmonic valve regurgitation is not visualized. No evidence of pulmonic stenosis. Aorta: The ascending aorta was not well visualized and the aortic root is normal in size and structure. Venous: The inferior vena cava is normal in size with greater than 50% respiratory variability, suggesting right atrial pressure of 3 mmHg. IAS/Shunts: No atrial level shunt detected by color flow Doppler.  LEFT VENTRICLE PLAX 2D LVIDd:         7.60 cm      Diastology LVIDs:         6.70 cm      LV e' medial:  6.42 cm/s LV PW:         1.30 cm      LV e' lateral: 10.30 cm/s LV IVS:        0.80 cm LVOT diam:     1.80 cm LV SV:         21 LV SV Index:   11 LVOT Area:     2.54 cm  LV Volumes (MOD) LV vol d, MOD A2C: 384.0 ml LV vol d, MOD A4C: 363.0 ml LV vol s, MOD A2C: 317.0 ml LV vol s, MOD A4C: 257.0 ml LV SV MOD A2C:     67.0 ml LV SV MOD A4C:     363.0 ml LV SV MOD BP:      88.2 ml RIGHT VENTRICLE             IVC RV Basal diam:  4.90 cm     IVC diam: 2.30 cm RV Mid diam:    3.40 cm RV  S prime:     11.40 cm/s TAPSE (M-mode): 1.5 cm LEFT ATRIUM              Index        RIGHT ATRIUM           Index LA diam:        5.00 cm  2.66  cm/m   RA Area:     26.70 cm LA Vol (A2C):   106.0 ml 56.40 ml/m  RA Volume:   95.00 ml  50.55 ml/m LA Vol (A4C):   104.0 ml 55.34 ml/m LA Biplane Vol: 108.0 ml 57.46 ml/m  AORTIC VALVE                    PULMONIC VALVE AV Area (Vmax):    1.18 cm     PV Vmax:       0.72 m/s AV Area (Vmean):   1.15 cm     PV Vmean:      41.600 cm/s AV Area (VTI):     1.07 cm     PV VTI:        0.082 m AV Vmax:           130.00 cm/s  PV Peak grad:  2.1 mmHg AV Vmean:          89.000 cm/s  PV Mean grad:  1.0 mmHg AV VTI:            0.199 m AV Peak Grad:      6.8 mmHg AV Mean Grad:      4.0 mmHg LVOT Vmax:         60.20 cm/s LVOT Vmean:        40.100 cm/s LVOT VTI:          0.084 m LVOT/AV VTI ratio: 0.42  AORTA Ao Root diam: 2.70 cm MITRAL VALVE              TRICUSPID VALVE MV Area VTI:  0.15 cm    TV Peak grad:   50.1 mmHg MV Peak grad: 83.5 mmHg   TV Mean grad:   30.0 mmHg MV Mean grad: 56.0 mmHg   TV Vmax:        3.54 m/s MV Vmax:      4.57 m/s    TV Vmean:       258.0 cm/s MV Vmean:     355.0 cm/s  TV VTI:         0.98 msec                           TR Peak grad:   32.7 mmHg                           TR Vmax:        286.00 cm/s                            SHUNTS                           Systemic VTI:  0.08 m                           Systemic Diam: 1.80 cm Kardie Tobb DO Electronically signed by Berniece Salines DO Signature Date/Time: 07/09/2021/11:49:20  AM    Final    [place] EKG SITE RITE  Result Date: 07/08/2021 If Site Rite image not attached, placement could not be confirmed due to current cardiac rhythm.  [place] EKG SITE RITE  Result Date: 07/08/2021 If Site Rite image not attached, placement could not be confirmed due to current cardiac rhythm.   Discharge Medications   Allergies as of 07/09/2021       Reactions   Tape Itching, Rash   Adhesive tape        Medication List     STOP taking these medications    Entresto 24-26 MG Generic drug: sacubitril-valsartan   metolazone 2.5 MG tablet Commonly known  as: ZAROXOLYN   naproxen sodium 220 MG tablet Commonly known as: ALEVE       TAKE these medications    atorvastatin 20 MG tablet Commonly known as: LIPITOR Take 1 tablet (20 mg total) by mouth daily.   digoxin 0.125 MG tablet Commonly known as: LANOXIN Take 1 tablet (0.125 mg total) by mouth in the morning.   empagliflozin 10 MG Tabs tablet Commonly known as: Jardiance Take 1 tablet (10 mg total) by mouth daily before breakfast.   ibuprofen 200 MG tablet Commonly known as: ADVIL Take 400 mg by mouth daily as needed for headache.   potassium chloride 10 MEQ tablet Commonly known as: KLOR-CON M Take 40 mEq by mouth 2 (two) times daily.   rivaroxaban 20 MG Tabs tablet Commonly known as: XARELTO Take 1 tablet (20 mg total) by mouth daily with supper. What changed: when to take this   spironolactone 25 MG tablet Commonly known as: ALDACTONE Take 1 tablet (25 mg total) by mouth in the morning.   torsemide 20 MG tablet Commonly known as: DEMADEX Take 5 tablets (100 mg total) by mouth 2 (two) times daily.         Disposition   The patient will be discharged in stable condition to home. Discharge Instructions     Diet - low sodium heart healthy   Complete by: As directed    Increase activity slowly   Complete by: As directed        Follow-up Information     [facility] [place] Follow up on 07/16/2021.   Specialty: Cardiology Why: Advanced Heart Failure Clinic at [facility] 11:30 am Entrance C, Garage Code [id number] Contact information: [address] [id number] [place] [phone]                  Duration of Discharge Encounter: Greater than 35 minutes   Signed, [facility], Lynder Parents  07/09/2021, 5:03 PM   Patient seen and examined with the above-signed Advanced Practice Provider and/or Housestaff. I personally reviewed laboratory data, imaging studies and relevant notes. I  independently examined the patient and formulated the important aspects of the plan. I have edited the note to reflect any of my changes or salient points. I have personally discussed the plan with the patient and/or family.  Patient signed out AMA despite a long discussion about how tenuous he was and risk of progressive HF, shock and death. Palliative Care consulted urgently prior to him signing out.  Time spent > 35 minutes.  Glori Bickers, MD  10:05 PM

## 2021-07-09 NOTE — Progress Notes (Addendum)
Advanced Heart Failure Rounding Note  PCP-Cardiologist: None   Subjective:   Admitted 1/31 with volume overload and low output heart failure. CO-OX 47%. Started on milrinone 0.25 mcg and diuresing with IV lasix. Negative 1.2 liters.   On milrinone 0.375 mcg  today ---> CO-OX 38%.   Creatinine 1.6>1.5  Feels bad. Had shortness with exertion over night. Says he uses amphetamines 2-3 times a week and cocaine could be in that. Wants to know when he can go home.    Objective:   Weight Range: 74.5 kg Body mass index is 24.97 kg/m.   Vital Signs:   Temp:  [97.6 F (36.4 C)-98.1 F (36.7 C)] 97.9 F (36.6 C) (02/01 0713) Pulse Rate:  [99-112] 112 (02/01 0713) Resp:  [16-20] 20 (02/01 0713) BP: (100-118)/(82-94) 113/92 (02/01 0713) SpO2:  [92 %-99 %] 97 % (02/01 0713) Weight:  [73.6 kg-74.5 kg] 74.5 kg (02/01 0500) Last BM Date: 07/08/21  Weight change: Filed Weights   07/08/21 1339 07/09/21 0500  Weight: 73.6 kg 74.5 kg    Intake/Output:   Intake/Output Summary (Last 24 hours) at 07/09/2021 0918 Last data filed at 07/09/2021 0715 Gross per 24 hour  Intake 225.34 ml  Output 1802 ml  Net -1576.66 ml      Physical Exam   CVP 6 checked in the chair. General:   No resp difficulty. Sitting in the chair.  HEENT: Normal Neck: Supple. JVP .6-7  Carotids 2+ bilat; no bruits. No lymphadenopathy or thyromegaly appreciated. Cor: PMI nondisplaced. Tachy Regular rate & rhythm. No rubs, or murmurs.+S3  Lungs: Clear Abdomen: Soft, nontender, nondistended. No hepatosplenomegaly. No bruits or masses. Good bowel sounds. Extremities: No cyanosis, clubbing, rash, edema. RUE PICC  Neuro: Alert & orientedx3, cranial nerves grossly intact. moves all 4 extremities w/o difficulty. Affect pleasant   Telemetry   Sinus Tach 100s   EKG    N/A   Labs    CBC Recent Labs    07/08/21 1422 07/09/21 0511  WBC 12.1* 15.7*  HGB 12.5* 13.2  HCT 37.5* 39.0  MCV 90.4 90.9  PLT 229 233    Basic Metabolic Panel Recent Labs    39/76/73 1422 07/09/21 0511  NA 138 137  K 2.9* 4.4  CL 98 98  CO2 29 28  GLUCOSE 160* 124*  BUN 25* 23*  CREATININE 1.66* 1.47*  CALCIUM 9.1 9.4  MG 2.3  --    Liver Function Tests Recent Labs    07/08/21 1422  AST 32  ALT 41  ALKPHOS 106  BILITOT 0.8  PROT 6.6  ALBUMIN 3.8   No results for input(s): LIPASE, AMYLASE in the last 72 hours. Cardiac Enzymes No results for input(s): CKTOTAL, CKMB, CKMBINDEX, TROPONINI in the last 72 hours.  BNP: BNP (last 3 results) Recent Labs    10/08/20 1303 07/01/21 0952 07/08/21 1422  BNP 446.5* 1,488.7* 3,065.4*    ProBNP (last 3 results) No results for input(s): PROBNP in the last 8760 hours.   D-Dimer No results for input(s): DDIMER in the last 72 hours. Hemoglobin A1C No results for input(s): HGBA1C in the last 72 hours. Fasting Lipid Panel No results for input(s): CHOL, HDL, LDLCALC, TRIG, CHOLHDL, LDLDIRECT in the last 72 hours. Thyroid Function Tests Recent Labs    07/08/21 1422  TSH 0.908    Other results:   Imaging    DG Chest 2 View  Result Date: 07/08/2021 CLINICAL DATA:  Shortness of breath. EXAM: CHEST - 2 VIEW COMPARISON:  July 11, 2018. FINDINGS: Stable cardiomegaly. Both lungs are clear. The visualized skeletal structures are unremarkable. IMPRESSION: No active cardiopulmonary disease. Electronically Signed   By: Marijo Conception M.D.   On: 07/08/2021 14:53   [place] EKG SITE RITE  Result Date: 07/08/2021 If Site Rite image not attached, placement could not be confirmed due to current cardiac rhythm.  [place] EKG SITE RITE  Result Date: 07/08/2021 If Site Rite image not attached, placement could not be confirmed due to current cardiac rhythm.    Medications:     Scheduled Medications:  atorvastatin  20 mg Oral Daily   Chlorhexidine Gluconate Cloth  6 each Topical Daily   digoxin  0.125 mg Oral q AM   empagliflozin  10 mg Oral QAC breakfast    furosemide  80 mg Intravenous BID   potassium chloride  40 mEq Oral TID   sodium chloride flush  10-40 mL Intracatheter Q12H   sodium chloride flush  3 mL Intravenous Q12H   spironolactone  25 mg Oral q AM    Infusions:  sodium chloride     heparin     milrinone 0.375 mcg/kg/min (07/09/21 0548)    PRN Medications: sodium chloride, acetaminophen, sodium chloride flush, sodium chloride flush    Patient Profile   51 y/o male with severe NICM direct admitted today for progressive HF symptoms NYHA IV with progressive volume overload not responding to increasing oral diuretics Assessment/Plan  1. Acute on Chronic Combined Systolic and Diastolic Heart Failure --->Cardiogenic Shock - Chronic systolic HF due to NICM from cocaine abuse.   - LHC 2018 no significant coronary disease.  - Echo 09/2017: EF 30-35%.  - Echo 2/20: EF 15-20%. RHC (06/09/18) with CO 4.1, CI 2.24.  - Echo 07/14/18: EF 15-20%, dilated CM, anteroseptal, inferoseptal, and inferior akinesis. Otherwise severe global hypokinesis, grade 3 DD, 2.6 cm mobile thrombus at apical lateral myocardium, severely dilated LA, mildly dilated RA, moderate MR, mild calcification of aortic valve. RV mild to moderate HK.   - Echo repeated at [facility] 08/2019: EF 15%. RV systolic function mildly reduced - He has cancelled RHC 3 times.   - Lactic acid 2.6 - CO-OX  38% today. Milrinone was increased to 0.375 mcg earlier this morning. Check CO-OX now.   - CVP 5-6 today. . Stop IV lasix and start torsemide 100 mg twice a day.  -No room for BB ARNi with soft bp/low output  - Continue Jardiance 10 mg daily.   - Continue spiro 25 mg daily.  - Continue digoxin 0.125 mg daily.  - Renal function stable.  - Not a candidate for advanced therapies with ongoing drug abuse. Would not need send home on inotropes with ongoing drug abuse.  - We started discussing goals of care given we have limited options.    2. Cocaine use - Reports Last used 03/2019. - UDS at  [facility] 09/2019 negative (see in Care Everywhere). - UDS 07/08/21 + Cocaine + amphetamines+ marijuana   3. Tobacco Abuse - Discussed tobacco cessation.    4. H/o LV Thrombus: - Noted on echo 07/2018 - Echo repeated at [facility] 3/21 and not visualized - Prior to admit on Xarelto 20 mg daily.  - Now on heparin drip for procedures.     5. Mitral Regurgitation  - likely functional, moderate on previous echo - repeat echo    6. Social: - He has been approved for disability.  - Living in motel and in his boss's car. - He was  referred to Paramedicine in [place] but ultimately changed his mind. - Consult HF TOC/SW team   Consult Palliative Care consulted for [place].    He wants to go home as soon as possible.   Length of Stay: 1  Amy Clegg, NP  07/09/2021, 9:18 AM  Advanced Heart Failure Team Pager [phone] (M-F; 7a - 5p)  Please contact [facility] for night-coverage after hours (5p -7a ) and weekends on [email or web address]  Agree with above.  Remains on milrinone. Lactate up. Co-ox 38%  Lethargic. Denies SOB.   General:  Weak appearing. No resp difficulty HEENT: normal Neck: supple. JVP mildly elevated, Carotids 2+ bilat; no bruits. No lymphadenopathy or thryomegaly appreciated. Cor: PMI nondisplaced. Regular tachy + s3 Lungs: clear Abdomen: soft, nontender, nondistended. No hepatosplenomegaly. No bruits or masses. Good bowel sounds. Extremities: no cyanosis, clubbing, rash, 1+ edema cool Neuro: alert & orientedx3, cranial nerves grossly intact. moves all 4 extremities w/o difficulty. Affect pleasant  He remains in shock. Agree with increasing milrinone. Tox screen + amphetamines/THC/cocaine so not candidate for advanced therapies. Options very limited. Agree with [place] discussions.   I spoke with him at length to discuss a possible path to advanced therapies but it was predicated on complete cessation of drug use.   At the end of the discussion he said that he was just too stressed and  had to leave the hospital. We discussed that once he comes off milrinone he will go back in shock quickly and be very uncomfortable. He agreed to see the Hospice team prior to leaving so that we could help prevent suffering.   CRITICAL CARE Performed by: Glori Bickers  Total critical care time: 35 minutes  Critical care time was exclusive of separately billable procedures and treating other patients.  Critical care was necessary to treat or prevent imminent or life-threatening deterioration.  Critical care was time spent personally by me (independent of midlevel providers or residents) on the following activities: development of treatment plan with patient and/or surrogate as well as nursing, discussions with consultants, evaluation of patient's response to treatment, examination of patient, obtaining history from patient or surrogate, ordering and performing treatments and interventions, ordering and review of laboratory studies, ordering and review of radiographic studies, pulse oximetry and re-evaluation of patient's condition.  Glori Bickers, MD  12:07 PM

## 2021-07-09 NOTE — TOC Progression Note (Addendum)
Transition of Care [facility]) - Progression Note    Patient Details  Name: Joachim Carton MRN: [id number] Date of Birth: 1970-08-12  Transition of Care [facility]) CM/SW Contact  Leone Haven, RN Phone Number: 07/09/2021, 4:11 PM  Clinical Narrative:    NCM notified Patient is for dc today home with Hospice, NCM received call from [facility] [place] , she states [facility] with Palliative states he is ready to leave now.  NCM asked Watt Climes  with [facility] if she needed anything from this NCM ,she stated no.  She did ask for [place] HF CSW phone number.  NCM gave her the phone number.  NCM also checked with HF CSW to see if she needed assistance, she states no,everything is set up. Patient is not going home on milrinone due to PSA. Per Watt Climes with [facility] , patient will be seen tonight.   Expected Discharge Plan: Home/Self Care Barriers to Discharge: Continued Medical Work up  Expected Discharge Plan and Services Expected Discharge Plan: Home/Self Care In-house Referral: Clinical Social Work Discharge Planning Services: CM Consult   Living arrangements for the past 2 months: Single Family Home                                       Social Determinants of Health (SDOH) Interventions Food Insecurity Interventions: Intervention Not Indicated, Other (Comment) (reports getting Sales executive) Financial Strain Interventions: Intervention Not Indicated Housing Interventions: Intervention Not Indicated Transportation Interventions: Intervention Not Indicated  Readmission Risk Interventions No flowsheet data found.

## 2021-07-10 ENCOUNTER — Encounter (HOSPITAL_COMMUNITY): Payer: Medicaid Other

## 2021-07-14 NOTE — Progress Notes (Incomplete)
Advanced Heart Failure Clinic Note    PCP: None  PCP-Cardiologist: [name]/Dr. Haroldine Laws   HPI: Paul Mccall is a 51 y.o.. male with chronic systolic HF due to NICM, HTN, bipolar disorder, h/o cocaine abuse, tobacco abuse, and medication noncompliance.    He was seen in [place] ED 7 times in 2019 with CP +/- volume overload associated with medication noncompliance and ongoing cocaine use. Typically given IV lasix with improvement.    Admitted (12/19) to [facility] with ADHF. He had not been taking medications due to confusion and cost. He had used cocaine within the week. EKG showed new LBBB at that time. Troponin was negative. Thought to be secondary to worsening CM and not ischemia.    Admitted 1/20 for ADHF w/ low output and required milrinone. EF 15-20%, grade 3 DD, 2.6 cm mobile thrombus at apical lateral myocardium, moderate MR. Started on Coumadin w/ heparin bridge. Weaned off milrinone w/ marginal co-ox ~64% but not felt to be a candidate for home inotrope's nor advanced therapies due to drug abuse. Placed on GDMT and discharged, but failed to f/u in the [facility]. He has however been followed by [facility] and sees Dr. Minette Brine.  Admitted to [facility] 2/20, he was also incarcerated x 4 months and several of his meds were changed. Since being released from prison, he denies any further cocaine use.   Admitted to [facility] 4/21 for ADHF left AMA after 3 days. Reports he wanted to come back to [facility] for a second opinion and further care.  2021 ECHO showed LVEF 15%. RV systolic function mildly reduced.   He was seen in the HF clinic 10/08/20. Started on Jardiance and torsemide increased to 100 mg bid.    In August 2022 he was set up for [facility] and cancelled 3 times.   Follow up 11/22, he was stressed due to family issues. Out of [place], [place] and digoxin. Meds refilled and referred to [facility].  Today he returns for HF follow up. He cancelled and no-showed last 2 appts.  He is SOB with minimal activity, has to stop twice from walking from car to entrance of grocery store. Getting worse over past 1-2 weeks. He has been taking his meds, missed a couple days of torsemide last week due to pharmacy mix up. Unable to get refill for metolazone so has not taken in 1.5 weeks. He is staying in a motel now and his disability is approved. He feels bloated and legs are starting to swell. He has some dizziness but no syncope. He chronically sleep reclined. Denies CP or palpitations. Has noticed bleeding with BMs, and attributes this to hemorrhoids. Appetite ok. No fever or chills. Weight at home 163-165 pounds. Smoking 1 pack/3-4 days, no cocaine in 2 years, smokes some THC. He has a lot of life stressors right now.   SH: Living in [place]. Has Medicaid. Incarcerated x 4 months in 2020. Prior poly substance abuse but has been cocaine free since 03/2019. Continues to smoke cigarettes, 1/2 ppd. Using THC gummies.   FH: His father had an MI at 38 years old and has CHF. No other family hx of CAD, CHF, or SCD.    Cardiac Studies   - RHC 06/09/18: Hemodynamics (mmHg) RA mean 2 RV 39/7 PA 37/14 (24) PCWP 14 Cardiac Output (Fick) 4.1 Cardiac Index (Fick) 2.24 PVR (MAP-CVP/CO) x 80: 195 SVR 1376   - LHC 12/14/2016: no angiographic evidence of significant coaronary disease   - Echo 08/2016:  EF 35%, normal RV - Echo 09/2017: EF 30-35%, mild MR, normal RV - Echo 09/2019 ([name]): EF 15%. RV systolic function mildly reduce   Past Medical History:  Diagnosis Date   CHF (congestive heart failure) (HCC)    Dental infection    Enlarged heart    Hernia, inguinal, right    Hypertension    Current Outpatient Medications  Medication Sig Dispense Refill   atorvastatin (LIPITOR) 20 MG tablet Take 1 tablet (20 mg total) by mouth daily. 30 tablet 3   digoxin (LANOXIN) 0.125 MG tablet Take 1 tablet (0.125 mg total) by mouth in the morning. 30 tablet 3   empagliflozin (JARDIANCE) 10 MG TABS tablet  Take 1 tablet (10 mg total) by mouth daily before breakfast. 30 tablet 3   ibuprofen (ADVIL) 200 MG tablet Take 400 mg by mouth daily as needed for headache.     potassium chloride (KLOR-CON M) 10 MEQ tablet Take 40 mEq by mouth 2 (two) times daily.     rivaroxaban (XARELTO) 20 MG TABS tablet Take 1 tablet (20 mg total) by mouth daily with supper. (Patient taking differently: Take 20 mg by mouth daily.) 30 tablet 6   spironolactone (ALDACTONE) 25 MG tablet Take 1 tablet (25 mg total) by mouth in the morning. (Patient not taking: Reported on 07/09/2021) 30 tablet 3   torsemide (DEMADEX) 20 MG tablet Take 5 tablets (100 mg total) by mouth 2 (two) times daily. 300 tablet 0   No current facility-administered medications for this visit.   Allergies  Allergen Reactions   Tape Itching and Rash    Adhesive tape   Social History   Socioeconomic History   Marital status: Significant Other    Spouse name: Not on file   Number of children: 2   Years of education: Not on file   Highest education level: Not on file  Occupational History   Not on file  Tobacco Use   Smoking status: Every Day   Smokeless tobacco: Never  Substance and Sexual Activity   Alcohol use: Yes    Comment: occasionally   Drug use: Not on file   Sexual activity: Not on file  Other Topics Concern   Not on file  Social History Narrative   07/19/18- Pt transportation issues due to inability to afford gas- has car but does not have job- disability application to be completed with [employer].  Pt also reported food insecurity- food resources provided.   Social Determinants of Health   Financial Resource Strain: Low Risk    Difficulty of Paying Living Expenses: Not very hard  Food Insecurity: No Food Insecurity   Worried About Charity fundraiser in the Last Year: Never true   Ran Out of Food in the Last Year: Never true  Transportation Needs: No Transportation Needs   Lack of Transportation (Medical): No   Lack of  Transportation (Non-Medical): No  Physical Activity: Not on file  Stress: Not on file  Social Connections: Not on file  Intimate Partner Violence: Not on file   Family History  Problem Relation Age of Onset   CAD Father    Heart failure Father    There were no vitals taken for this visit.  Wt Readings from Last 3 Encounters:  07/09/21 74.5 kg  07/01/21 77.1 kg  04/24/21 74.7 kg   PHYSICAL EXAM: General:  NAD. Mild SOB speaking full sentences, fatigued-appearing, pale HEENT: Normal Neck: Supple. JVP to ear. Carotids 2+ bilat; no bruits. No  lymphadenopathy or thryomegaly appreciated. Cor: PMI nondisplaced. Regular rate & rhythm. No rubs, gallops, 2/6 MR Lungs: Clear, diminished in bases. Abdomen: nontender, + distended. No hepatosplenomegaly. No bruits or masses. Good bowel sounds. Extremities: No cyanosis, clubbing, rash, 1+ BLE edema, legs warm, arms cool Neuro: Alert & oriented x 3, cranial nerves grossly intact. Moves all 4 extremities w/o difficulty. Affect pleasant.  ECG (personally reviewed): NSR IVCD qrs 172 msec.  ReDs: 48%  ASSESSMENT & PLAN: 1. Acute on chronic Systolic HF - Chronic systolic HF due to NICM from cocaine abuse.   - LHC 2018 no significant coronary disease.  - Echo 09/2017: EF 30-35%.  - Echo 2/20: EF 15-20%. RHC (06/09/18) with CO 4.1, CI 2.24.  - Echo 07/14/18: EF 15-20%, dilated CM, anteroseptal, inferoseptal, and inferior akinesis. Otherwise severe global hypokinesis, grade 3 DD, 2.6 cm mobile thrombus at apical lateral myocardium, severely dilated LA, mildly dilated RA, moderate MR, mild calcification of aortic valve. RV mild to moderate HK.   - Echo [facility] at [facility] 08[facility]2019: EF 15%. RV systolic function mildly reduced - He has cancelled RHC 3 times.   - Worse NYHA IIIb- early IV. Volume status up today, likely has 10-15 lbs of fluid on board. ReDs 48% - Discussed admission for IV diuresis + short term inotrope, he declined this and wants to try  aggressive outpatient diuresis. - Give Lasix 80 mg IV + metolazone 2.5 mg + 40 KCL today in clinic. - Starting tomorrow (07/02/21), take metolazone 2.5 + 40 KCL and Thursday (07/03/21). - Hold Entresto with low BP.  - Continue torsemide 100 mg bid. - Hold off on bb with progound fatigue and severe LV dysfunction.  - Continue Jardiance 10 mg daily.   - Continue spiro 25 mg daily.  - Continue digoxin 0.125 mg daily. - No a candidate for advanced therapies with non-compliance. - Labs today. Repeat BMET next week.  2. Prior h/o cocaine use - Last used 03/2019. - UDS at [facility] 09/2019 negative (see in Care Everywhere).  3. Tobacco Abuse - Discussed tobacco cessation.   4. H/o LV Thrombus: - Noted on echo 07/2018 - Echo repeated at [facility] 3/21 and not visualized - Had been on Coumadin but stopped and was placed on Xarelto. Sounds like xarelto was stopped in prison.  - Continue Xarelto 20 mg daily. - CBC today.  5. Social: - He has been approved for disability.  - Living in motel and in his boss's car. - He was referred to Paramedicine in [place] but ultimately changed his mind. - Stressed the importance of medication compliance.  - HFSW to follow up next week with further needs.  Discussed with Dr. Haroldine Laws. Recommend admission for IV diuresis and possible inotropic support. He declined this and would like to try outpatient aggressive diuresis. Long discussion about trajectory of his heart failure. I am worried he will not do well over the next couple of days. He understands the risks associated with going home today. He has voided >1L urine in clinic, feels some better.  BP marginally better, but still low. He is requesting to leave to pick up his grandson. I strongly  urged him not to drive with BP 86 systolic.  Follow up with APP next week, if no better will need admission.  [place], [place] 07/14/21  Patient seen and examined with the above-signed Advanced Practice Provider  and/or Housestaff. I personally reviewed laboratory data, imaging studies and relevant notes. I independently examined the patient and  formulated the important aspects of the plan. I have edited the note to reflect any of my changes or salient points. I have personally discussed the plan with the patient and/or family.  He is very tenuous and appears to be be low output with significant volume overload. Systolic BP low.   General:  Weak appearing. No resp difficulty HEENT: normal Neck: supple.JVP to ear. Carotids 2+ bilat; no bruits. No lymphadenopathy or thryomegaly appreciated. Cor: PMI nondisplaced. Regular rate & rhythm. +s3 Lungs: clear Abdomen: soft, nontender, nondistended. No hepatosplenomegaly. No bruits or masses. Good bowel sounds. Extremities: no cyanosis, clubbing, rash, 1-2+ edema cool  Neuro: alert & orientedx3, cranial nerves grossly intact. moves all 4 extremities w/o difficulty. Affect pleasant  He appears end-stage. Long discussion about need for admission and attempt to stabilize but he refuses. He realizes there is high risk for further decompensation and death. We told him to call and we can arrange for direct admission if he is feeling worse. Otherwise plan as above.   Total time spent 45 minutes. Over half that time spent discussing above.   Rafael Bihari, FNP  1:56 PM

## 2021-07-15 ENCOUNTER — Telehealth (HOSPITAL_COMMUNITY): Payer: Self-pay

## 2021-07-15 NOTE — Telephone Encounter (Signed)
Called and was unable to leave a voice message to confirm/remind patient of their appointment at the Advanced Heart Failure Clinic on 07/16/21.

## 2021-07-16 ENCOUNTER — Inpatient Hospital Stay (HOSPITAL_COMMUNITY): Admit: 2021-07-16 | Discharge: 2021-07-16 | Disposition: A | Discharge status: Home / Self Care | Payer: Medicaid Other

## 2021-08-06 DEATH — deceased
